# Patient Record
Sex: Female | Born: 1975 | Race: Black or African American | Hispanic: No | Marital: Single | Smoking: Current every day smoker
Health system: Southern US, Community
[De-identification: ages and names within clinical notes are randomized; demographics above are authoritative.]

## PROBLEM LIST (undated history)

## (undated) ENCOUNTER — Inpatient Hospital Stay (HOSPITAL_COMMUNITY): Payer: Self-pay

## (undated) DIAGNOSIS — L732 Hidradenitis suppurativa: Secondary | ICD-10-CM

## (undated) DIAGNOSIS — O24419 Gestational diabetes mellitus in pregnancy, unspecified control: Secondary | ICD-10-CM

## (undated) HISTORY — PX: SALPINGECTOMY: SHX328

## (undated) HISTORY — PX: ECTOPIC PREGNANCY SURGERY: SHX613

## (undated) HISTORY — DX: Gestational diabetes mellitus in pregnancy, unspecified control: O24.419

## (undated) HISTORY — PX: OTHER SURGICAL HISTORY: SHX169

## (undated) HISTORY — PX: DILATION AND CURETTAGE OF UTERUS: SHX78

---

## 1998-01-31 ENCOUNTER — Inpatient Hospital Stay (HOSPITAL_COMMUNITY): Admission: AD | Admit: 1998-01-31 | Discharge: 1998-01-31 | Payer: Self-pay | Admitting: *Deleted

## 1998-02-26 ENCOUNTER — Inpatient Hospital Stay (HOSPITAL_COMMUNITY): Admission: AD | Admit: 1998-02-26 | Discharge: 1998-02-26 | Payer: Self-pay | Admitting: *Deleted

## 1998-04-29 ENCOUNTER — Ambulatory Visit (HOSPITAL_COMMUNITY): Admission: RE | Admit: 1998-04-29 | Discharge: 1998-04-29 | Payer: Self-pay | Admitting: *Deleted

## 1998-07-19 ENCOUNTER — Inpatient Hospital Stay (HOSPITAL_COMMUNITY): Admission: AD | Admit: 1998-07-19 | Discharge: 1998-07-19 | Payer: Self-pay | Admitting: Obstetrics

## 1998-08-26 ENCOUNTER — Inpatient Hospital Stay (HOSPITAL_COMMUNITY): Admission: AD | Admit: 1998-08-26 | Discharge: 1998-08-26 | Payer: Self-pay | Admitting: Obstetrics & Gynecology

## 1998-09-11 ENCOUNTER — Emergency Department (HOSPITAL_COMMUNITY): Admission: EM | Admit: 1998-09-11 | Discharge: 1998-09-11 | Payer: Self-pay | Admitting: Emergency Medicine

## 1998-09-14 ENCOUNTER — Emergency Department (HOSPITAL_COMMUNITY): Admission: EM | Admit: 1998-09-14 | Discharge: 1998-09-14 | Payer: Self-pay

## 1998-09-21 ENCOUNTER — Inpatient Hospital Stay (HOSPITAL_COMMUNITY): Admission: AD | Admit: 1998-09-21 | Discharge: 1998-09-21 | Payer: Self-pay | Admitting: Obstetrics & Gynecology

## 1998-09-22 ENCOUNTER — Inpatient Hospital Stay (HOSPITAL_COMMUNITY): Admission: AD | Admit: 1998-09-22 | Discharge: 1998-09-24 | Payer: Self-pay | Admitting: Obstetrics & Gynecology

## 1999-04-13 ENCOUNTER — Other Ambulatory Visit: Admission: RE | Admit: 1999-04-13 | Discharge: 1999-04-13 | Payer: Self-pay | Admitting: Obstetrics

## 2001-07-03 ENCOUNTER — Emergency Department (HOSPITAL_COMMUNITY): Admission: EM | Admit: 2001-07-03 | Discharge: 2001-07-03 | Payer: Self-pay | Admitting: Emergency Medicine

## 2001-11-01 ENCOUNTER — Inpatient Hospital Stay (HOSPITAL_COMMUNITY): Admission: AD | Admit: 2001-11-01 | Discharge: 2001-11-01 | Payer: Self-pay | Admitting: Obstetrics

## 2001-11-16 ENCOUNTER — Encounter: Admission: RE | Admit: 2001-11-16 | Discharge: 2001-11-16 | Payer: Self-pay | Admitting: Obstetrics

## 2002-08-21 ENCOUNTER — Inpatient Hospital Stay (HOSPITAL_COMMUNITY): Admission: AD | Admit: 2002-08-21 | Discharge: 2002-08-21 | Payer: Self-pay | Admitting: Obstetrics

## 2003-07-10 ENCOUNTER — Inpatient Hospital Stay (HOSPITAL_COMMUNITY): Admission: AD | Admit: 2003-07-10 | Discharge: 2003-07-10 | Payer: Self-pay | Admitting: Obstetrics

## 2003-08-01 ENCOUNTER — Encounter (HOSPITAL_BASED_OUTPATIENT_CLINIC_OR_DEPARTMENT_OTHER): Payer: Self-pay | Admitting: General Surgery

## 2003-08-05 ENCOUNTER — Encounter (INDEPENDENT_AMBULATORY_CARE_PROVIDER_SITE_OTHER): Payer: Self-pay | Admitting: *Deleted

## 2003-08-05 ENCOUNTER — Ambulatory Visit (HOSPITAL_COMMUNITY): Admission: RE | Admit: 2003-08-05 | Discharge: 2003-08-05 | Payer: Self-pay | Admitting: General Surgery

## 2003-10-12 ENCOUNTER — Inpatient Hospital Stay (HOSPITAL_COMMUNITY): Admission: AD | Admit: 2003-10-12 | Discharge: 2003-10-12 | Payer: Self-pay | Admitting: Obstetrics

## 2004-03-25 ENCOUNTER — Inpatient Hospital Stay (HOSPITAL_COMMUNITY): Admission: AD | Admit: 2004-03-25 | Discharge: 2004-03-25 | Payer: Self-pay | Admitting: Obstetrics

## 2004-11-05 ENCOUNTER — Inpatient Hospital Stay (HOSPITAL_COMMUNITY): Admission: AD | Admit: 2004-11-05 | Discharge: 2004-11-05 | Payer: Self-pay | Admitting: Obstetrics

## 2005-06-02 ENCOUNTER — Inpatient Hospital Stay (HOSPITAL_COMMUNITY): Admission: AD | Admit: 2005-06-02 | Discharge: 2005-06-02 | Payer: Self-pay | Admitting: Family Medicine

## 2006-04-26 ENCOUNTER — Emergency Department (HOSPITAL_COMMUNITY): Admission: EM | Admit: 2006-04-26 | Discharge: 2006-04-26 | Payer: Self-pay | Admitting: Family Medicine

## 2006-07-27 ENCOUNTER — Emergency Department (HOSPITAL_COMMUNITY): Admission: EM | Admit: 2006-07-27 | Discharge: 2006-07-27 | Payer: Self-pay | Admitting: Family Medicine

## 2006-11-23 ENCOUNTER — Emergency Department (HOSPITAL_COMMUNITY): Admission: EM | Admit: 2006-11-23 | Discharge: 2006-11-23 | Payer: Self-pay | Admitting: Family Medicine

## 2007-09-19 ENCOUNTER — Ambulatory Visit (HOSPITAL_COMMUNITY): Admission: RE | Admit: 2007-09-19 | Discharge: 2007-09-19 | Payer: Self-pay | Admitting: Family Medicine

## 2007-10-10 ENCOUNTER — Ambulatory Visit (HOSPITAL_COMMUNITY): Admission: RE | Admit: 2007-10-10 | Discharge: 2007-10-10 | Payer: Self-pay | Admitting: Family Medicine

## 2007-10-24 ENCOUNTER — Ambulatory Visit (HOSPITAL_COMMUNITY): Admission: RE | Admit: 2007-10-24 | Discharge: 2007-10-24 | Payer: Self-pay | Admitting: Family Medicine

## 2008-03-07 ENCOUNTER — Inpatient Hospital Stay (HOSPITAL_COMMUNITY): Admission: AD | Admit: 2008-03-07 | Discharge: 2008-03-07 | Payer: Self-pay | Admitting: Obstetrics & Gynecology

## 2008-03-07 ENCOUNTER — Inpatient Hospital Stay (HOSPITAL_COMMUNITY): Admission: AD | Admit: 2008-03-07 | Discharge: 2008-03-09 | Payer: Self-pay | Admitting: Obstetrics & Gynecology

## 2008-03-07 ENCOUNTER — Ambulatory Visit: Payer: Self-pay | Admitting: Obstetrics & Gynecology

## 2010-01-14 ENCOUNTER — Emergency Department (HOSPITAL_COMMUNITY): Admission: EM | Admit: 2010-01-14 | Discharge: 2010-01-14 | Payer: Self-pay | Admitting: Emergency Medicine

## 2010-10-09 ENCOUNTER — Inpatient Hospital Stay (HOSPITAL_COMMUNITY): Admission: AD | Admit: 2010-10-09 | Discharge: 2010-10-10 | Payer: Self-pay | Admitting: Obstetrics & Gynecology

## 2010-10-09 ENCOUNTER — Ambulatory Visit: Payer: Self-pay | Admitting: Nurse Practitioner

## 2010-10-21 ENCOUNTER — Emergency Department (HOSPITAL_COMMUNITY): Admission: EM | Admit: 2010-10-21 | Discharge: 2010-10-21 | Payer: Self-pay | Admitting: Family Medicine

## 2011-02-16 LAB — WET PREP, GENITAL

## 2011-02-16 LAB — GC/CHLAMYDIA PROBE AMP, GENITAL
Chlamydia, DNA Probe: NEGATIVE
GC Probe Amp, Genital: NEGATIVE

## 2011-02-24 LAB — CULTURE, ROUTINE-ABSCESS

## 2011-04-23 NOTE — Op Note (Signed)
   Renee Schroeder, Renee Schroeder                         ACCOUNT NO.:  [id number]   MEDICAL RECORD NO.:  [id number]                   PATIENT TYPE:  OIB   LOCATION:  2895                                 FACILITY:  [facility]   PHYSICIAN:  Leonie Man, M.D.                DATE OF BIRTH:  07-Mar-1976   DATE OF PROCEDURE:  08/05/2003  DATE OF DISCHARGE:                                 OPERATIVE REPORT   PREOPERATIVE DIAGNOSIS:  Hidradenitis, right axilla.   POSTOPERATIVE DIAGNOSIS:  Hidradenitis, right axilla.   OPERATION PERFORMED:  Excision, hidradenitis, right axilla.   SURGEON:  Leonie Man, M.D.   ASSISTANT:  Nurse.   ANESTHESIA:  General.   INDICATIONS FOR PROCEDURE:  The patient is a 36 year old woman with  recurrent hydradenitis of the left axilla treated with warm compresses and  antibiotics.  She comes to the operating room now for excision of her  hidradenitis after the risks and potential benefits of surgery had been  fully discussed, all questions answered and consent obtained.   DESCRIPTION OF PROCEDURE:  Following the induction of satisfactory general  anesthesia, the right axilla and arm were prepped and draped to be included  in a sterile operative field.  The region around the hidradenitis  infiltrated with 0.5% Marcaine with epinephrine.  The region was extended  excised with a rather large ellipse axilla extending from the anterior  axilla to the posterior axilla.  There were some other lesions somewhat  lower down on the arm which were excised in a sort of a V-shaped incision  extending from the primary incision.  All areas of hidradenitis that were  visible were excised in entirety.  Hemostasis was obtained with  electrocautery.  Sponge and instrument counts were verified.  Subcutaneous  tissue was closed with interrupted 3-0 Vicryl sutures.  This was closed a T-  shaped fashion, so as to accommodate the extra tissue that was taken from  the upper arm.  The skin  was closed with a running 4-0 Monocryl suture.  Sterile dressings were applied.  Anesthetic was reversed and the patient  removed from the operating room to the recovery room in stable condition,  having tolerated the procedure well.                                                Leonie Man, M.D.    PB/MEDQ  D:  08/05/2003  T:  08/05/2003  Job:  [id number]

## 2011-08-31 LAB — STREP B DNA PROBE: Strep Group B Ag: NEGATIVE

## 2011-08-31 LAB — RPR: RPR Ser Ql: NONREACTIVE

## 2011-08-31 LAB — CBC
HCT: 41.7
Platelets: 199
RDW: 14.1
WBC: 8.2

## 2011-10-05 ENCOUNTER — Inpatient Hospital Stay (INDEPENDENT_AMBULATORY_CARE_PROVIDER_SITE_OTHER)
Admission: RE | Admit: 2011-10-05 | Discharge: 2011-10-05 | Disposition: A | Discharge status: Home / Self Care | Payer: Self-pay | Source: Ambulatory Visit | Attending: Family Medicine | Admitting: Family Medicine

## 2011-10-05 DIAGNOSIS — J020 Streptococcal pharyngitis: Secondary | ICD-10-CM

## 2011-10-05 LAB — POCT RAPID STREP A: Streptococcus, Group A Screen (Direct): NEGATIVE

## 2012-05-26 ENCOUNTER — Ambulatory Visit (HOSPITAL_COMMUNITY)
Admission: AD | Admit: 2012-05-26 | Discharge: 2012-05-27 | Disposition: A | Discharge status: Home / Self Care | Payer: Medicaid Other | Source: Ambulatory Visit | Attending: Obstetrics and Gynecology | Admitting: Obstetrics and Gynecology

## 2012-05-26 ENCOUNTER — Encounter (HOSPITAL_COMMUNITY)
Admission: AD | Disposition: A | Discharge status: Home / Self Care | Payer: Self-pay | Source: Ambulatory Visit | Attending: Obstetrics and Gynecology

## 2012-05-26 ENCOUNTER — Inpatient Hospital Stay (HOSPITAL_COMMUNITY): Payer: Medicaid Other | Admitting: Anesthesiology

## 2012-05-26 ENCOUNTER — Encounter (HOSPITAL_COMMUNITY): Payer: Self-pay | Admitting: *Deleted

## 2012-05-26 ENCOUNTER — Encounter (HOSPITAL_COMMUNITY): Payer: Self-pay | Admitting: Anesthesiology

## 2012-05-26 ENCOUNTER — Inpatient Hospital Stay (HOSPITAL_COMMUNITY): Payer: Medicaid Other

## 2012-05-26 DIAGNOSIS — O00109 Unspecified tubal pregnancy without intrauterine pregnancy: Principal | ICD-10-CM | POA: Insufficient documentation

## 2012-05-26 DIAGNOSIS — O009 Unspecified ectopic pregnancy without intrauterine pregnancy: Secondary | ICD-10-CM | POA: Diagnosis present

## 2012-05-26 HISTORY — PX: LAPAROSCOPY: SHX197

## 2012-05-26 LAB — URINALYSIS, ROUTINE W REFLEX MICROSCOPIC
Glucose, UA: NEGATIVE mg/dL
Ketones, ur: NEGATIVE mg/dL
Leukocytes, UA: NEGATIVE
Nitrite: NEGATIVE
Protein, ur: NEGATIVE mg/dL
Urobilinogen, UA: 0.2 mg/dL (ref 0.0–1.0)

## 2012-05-26 LAB — CBC
MCH: 30.2 pg (ref 26.0–34.0)
MCHC: 34.4 g/dL (ref 30.0–36.0)
MCV: 87.7 fL (ref 78.0–100.0)
Platelets: 231 10*3/uL (ref 150–400)
RBC: 3.91 MIL/uL (ref 3.87–5.11)

## 2012-05-26 LAB — POCT PREGNANCY, URINE: Preg Test, Ur: POSITIVE — AB

## 2012-05-26 LAB — URINE MICROSCOPIC-ADD ON

## 2012-05-26 SURGERY — LAPAROSCOPY OPERATIVE
Anesthesia: General | Site: Abdomen | Wound class: Clean Contaminated

## 2012-05-26 MED ORDER — LIDOCAINE HCL (CARDIAC) 20 MG/ML IV SOLN
INTRAVENOUS | Status: DC | PRN
  Administered 2012-05-26: 50 mg via INTRAVENOUS

## 2012-05-26 MED ORDER — NEOSTIGMINE METHYLSULFATE 1 MG/ML IJ SOLN
INTRAMUSCULAR | Status: DC | PRN
  Administered 2012-05-26: 3 mg via INTRAVENOUS

## 2012-05-26 MED ORDER — FAMOTIDINE IN NACL 20-0.9 MG/50ML-% IV SOLN
20.0000 mg | Freq: Once | INTRAVENOUS | Status: AC
  Administered 2012-05-26: 20 mg via INTRAVENOUS

## 2012-05-26 MED ORDER — LACTATED RINGERS IV SOLN
INTRAVENOUS | Status: DC
  Administered 2012-05-26 (×3): via INTRAVENOUS

## 2012-05-26 MED ORDER — CITRIC ACID-SODIUM CITRATE 334-500 MG/5ML PO SOLN
30.0000 mL | Freq: Once | ORAL | Status: AC
  Administered 2012-05-26: 30 mL via ORAL

## 2012-05-26 MED ORDER — CITRIC ACID-SODIUM CITRATE 334-500 MG/5ML PO SOLN
ORAL | Status: AC
  Filled 2012-05-26: qty 15

## 2012-05-26 MED ORDER — ROCURONIUM BROMIDE 100 MG/10ML IV SOLN
INTRAVENOUS | Status: DC | PRN
  Administered 2012-05-26: 25 mg via INTRAVENOUS
  Administered 2012-05-26: 5 mg via INTRAVENOUS

## 2012-05-26 MED ORDER — LACTATED RINGERS IR SOLN
Status: DC | PRN
  Administered 2012-05-26: 3000 mL

## 2012-05-26 MED ORDER — FENTANYL CITRATE 0.05 MG/ML IJ SOLN
INTRAMUSCULAR | Status: DC | PRN
  Administered 2012-05-26 (×2): 100 ug via INTRAVENOUS
  Administered 2012-05-26: 150 ug via INTRAVENOUS

## 2012-05-26 MED ORDER — KETOROLAC TROMETHAMINE 30 MG/ML IJ SOLN
INTRAMUSCULAR | Status: DC | PRN
  Administered 2012-05-26: 30 mg via INTRAVENOUS

## 2012-05-26 MED ORDER — EPHEDRINE SULFATE 50 MG/ML IJ SOLN
INTRAMUSCULAR | Status: DC | PRN
  Administered 2012-05-26: 10 mg via INTRAVENOUS

## 2012-05-26 MED ORDER — GLYCOPYRROLATE 0.2 MG/ML IJ SOLN
INTRAMUSCULAR | Status: DC | PRN
  Administered 2012-05-26: 0.3 mg via INTRAVENOUS

## 2012-05-26 MED ORDER — PROPOFOL 10 MG/ML IV EMUL
INTRAVENOUS | Status: DC | PRN
  Administered 2012-05-26: 200 mg via INTRAVENOUS

## 2012-05-26 MED ORDER — FAMOTIDINE IN NACL 20-0.9 MG/50ML-% IV SOLN
INTRAVENOUS | Status: AC
  Filled 2012-05-26: qty 50

## 2012-05-26 MED ORDER — SUCCINYLCHOLINE CHLORIDE 20 MG/ML IJ SOLN
INTRAMUSCULAR | Status: DC | PRN
  Administered 2012-05-26: 160 mg via INTRAVENOUS

## 2012-05-26 MED ORDER — ONDANSETRON 8 MG PO TBDP
8.0000 mg | ORAL_TABLET | Freq: Once | ORAL | Status: AC
  Administered 2012-05-26: 8 mg via ORAL
  Filled 2012-05-26: qty 1

## 2012-05-26 MED ORDER — MIDAZOLAM HCL 5 MG/5ML IJ SOLN
INTRAMUSCULAR | Status: DC | PRN
  Administered 2012-05-26 (×2): 1 mg via INTRAVENOUS

## 2012-05-26 MED ORDER — ONDANSETRON HCL 4 MG/2ML IJ SOLN
INTRAMUSCULAR | Status: DC | PRN
  Administered 2012-05-26: 4 mg via INTRAVENOUS

## 2012-05-26 SURGICAL SUPPLY — 30 items
BUPIVACAINE 0.25% IMPLANT
CHLORAPREP W/TINT 26ML (MISCELLANEOUS) ×2 IMPLANT
DERMABOND ADVANCED (GAUZE/BANDAGES/DRESSINGS) ×1
DERMABOND ADVANCED .7 DNX12 (GAUZE/BANDAGES/DRESSINGS) ×1 IMPLANT
ELECT REM PT RETURN 9FT ADLT (ELECTROSURGICAL) ×2
ELECTRODE REM PT RTRN 9FT ADLT (ELECTROSURGICAL) ×1 IMPLANT
GLOVE BIOGEL PI IND STRL 6.5 (GLOVE) ×2 IMPLANT
GLOVE BIOGEL PI INDICATOR 6.5 (GLOVE) ×2
GLOVE SKINSENSE NS SZ7.5 (GLOVE) ×1
GLOVE SKINSENSE NS SZ8.0 LF (GLOVE) ×1
GLOVE SKINSENSE STRL SZ7.5 (GLOVE) ×1 IMPLANT
GLOVE SKINSENSE STRL SZ8.0 LF (GLOVE) ×1 IMPLANT
GLOVE SURG SS PI 6.0 STRL IVOR (GLOVE) ×2 IMPLANT
GOWN PREVENTION PLUS LG XLONG (DISPOSABLE) ×4 IMPLANT
NS IRRIG 1000ML POUR BTL (IV SOLUTION) IMPLANT
PACK LAPAROSCOPY BASIN (CUSTOM PROCEDURE TRAY) ×2 IMPLANT
POUCH SPECIMEN RETRIEVAL 10MM (ENDOMECHANICALS) ×2 IMPLANT
PROTECTOR NERVE ULNAR (MISCELLANEOUS) ×2 IMPLANT
SEALER TISSUE G2 CVD JAW 35 (ENDOMECHANICALS) ×1 IMPLANT
SEALER TISSUE G2 CVD JAW 45CM (ENDOMECHANICALS) ×1
SET IRRIG TUBING LAPAROSCOPIC (IRRIGATION / IRRIGATOR) ×2 IMPLANT
SLEEVE SCD COMPRESS KNEE MED (MISCELLANEOUS) ×2 IMPLANT
SUT MNCRL AB 4-0 PS2 18 (SUTURE) ×2 IMPLANT
SUT VICRYL 0 UR6 27IN ABS (SUTURE) ×4 IMPLANT
TOWEL OR 17X24 6PK STRL BLUE (TOWEL DISPOSABLE) ×4 IMPLANT
TRAY FOLEY CATH 14FR (SET/KITS/TRAYS/PACK) ×2 IMPLANT
TROCAR BALLN 12MMX100 BLUNT (TROCAR) ×2 IMPLANT
TROCAR XCEL NON-BLD 11X100MML (ENDOMECHANICALS) ×2 IMPLANT
TROCAR XCEL NON-BLD 5MMX100MML (ENDOMECHANICALS) ×4 IMPLANT
WATER STERILE IRR 1000ML POUR (IV SOLUTION) IMPLANT

## 2012-05-26 NOTE — MAU Note (Signed)
Pt vomited large amt undigested food 

## 2012-05-26 NOTE — MAU Note (Signed)
U/s at bedside

## 2012-05-26 NOTE — MAU Note (Signed)
Pt called out stating she couldn't breathe. O2 sat 100. Renee Punt NP at bedside. Pt hyperventilating some. Encouraged to slow respiratons and emotional support given. After staying with pt few mins pt slowed breathing and more reassured.

## 2012-05-26 NOTE — Progress Notes (Signed)
Having bedside u/s. Alert. Having waves of intensity in pain but pain is constant.

## 2012-05-26 NOTE — Progress Notes (Signed)
Results of ultrasound reviewed. Patient examined with diffuse abdominal tenderness. Diagnosis of ruptured ectopic pregnancy explained along with need for surgical treatment with laparoscopy and removal of tube. Risks, benefits and alternatives explained including but not limited to risk of bleeding, infection and damage to adjacent organs. Patient verbalized understanding. All questions were answered. Patient consented for laparoscopic salpingectomy.

## 2012-05-26 NOTE — MAU Note (Signed)
Pt states, " I was washing my hair an hour ago and had a sudden onset of pain in my whole abdomen, with vaginal and rectal pain that feels like contractions. I started my period three days ago, but it has been acting up and has been irregular and stayed on longer."

## 2012-05-26 NOTE — MAU Provider Note (Signed)
History     CSN: [id number]  Arrival date and time: 05/26/12 1910   First Provider Initiated Contact with Patient 05/26/12 2058      Chief Complaint  Patient presents with  . Abdominal Pain  . Vaginal Bleeding   HPI Renee Schroeder 36 y.o. Comes to MAU tonight with sudden onset of abdominal pain about 6:30 pm which caused her to fall to the floor.  Had eaten lunch - had BBQ.  Had orange juice to drink at 5:30 p, amd pain began at 6:30 pm.  Vomiting in MAU.  Is diaphoretic.  Is chilling and then is hot.  Pregnancy test is positive and client is surprised.    OB History    Grav Para Term Preterm Abortions TAB SAB Ect Mult Living   3 2 2  0 1 1 0 0 0 2      Past Medical History  Diagnosis Date  . No pertinent past medical history     Past Surgical History  Procedure Date  . Dilation and curettage of uterus   . Arm amputation     R arm sweat glands removed due to hydrinitis    Family History  Problem Relation Age of Onset  . Other Neg Hx     History  Substance Use Topics  . Smoking status: Current Some Day Smoker  . Smokeless tobacco: Not on file  . Alcohol Use: No    Allergies: No Known Allergies  No prescriptions prior to admission    Review of Systems  Gastrointestinal: Positive for nausea, vomiting and abdominal pain.  Genitourinary: Negative for dysuria.   Physical Exam   Blood pressure 96/55, pulse 62, temperature 98.3 F (36.8 C), resp. rate 22, height 5\' 8"  (1.727 m), weight 183 lb (83.008 kg), last menstrual period 05/23/2012, SpO2 100.00%.  Physical Exam  Nursing note and vitals reviewed. Constitutional: She is oriented to person, place, and time. She appears well-developed and well-nourished.  HENT:  Head: Normocephalic.  Eyes: EOM are normal.  Neck: Neck supple.  GI: Soft. There is tenderness. There is guarding.  Musculoskeletal: Normal range of motion.  Neurological: She is alert and oriented to person, place, and time.  Skin: She is  diaphoretic.  Psychiatric: She has a normal mood and affect.    MAU Course  Procedures Zofran 8 mg ODT given, IV LR started.  Results for orders placed during the hospital encounter of 05/26/12 (from the past 24 hour(s))  URINALYSIS, ROUTINE W REFLEX MICROSCOPIC     Status: Abnormal   Collection Time   05/26/12  2:33 PM      Component Value Range   Color, Urine YELLOW  YELLOW   APPearance CLEAR  CLEAR   Specific Gravity, Urine 1.025  1.005 - 1.030   pH 6.0  5.0 - 8.0   Glucose, UA NEGATIVE  NEGATIVE mg/dL   Hgb urine dipstick LARGE (*) NEGATIVE   Bilirubin Urine NEGATIVE  NEGATIVE   Ketones, ur NEGATIVE  NEGATIVE mg/dL   Protein, ur NEGATIVE  NEGATIVE mg/dL   Urobilinogen, UA 0.2  0.0 - 1.0 mg/dL   Nitrite NEGATIVE  NEGATIVE   Leukocytes, UA NEGATIVE  NEGATIVE  URINE MICROSCOPIC-ADD ON     Status: Normal   Collection Time   05/26/12  2:33 PM      Component Value Range   Squamous Epithelial / LPF RARE  RARE   WBC, UA 0-2  <3 WBC/hpf   RBC / HPF 0-2  <3 RBC/hpf  POCT PREGNANCY, URINE     Status: Abnormal   Collection Time   05/26/12  8:19 PM      Component Value Range   Preg Test, Ur POSITIVE (*) NEGATIVE  HCG, QUANTITATIVE, PREGNANCY     Status: Abnormal   Collection Time   05/26/12  9:09 PM      Component Value Range   hCG, Beta Chain, Quant, S 3560 (*) <5 mIU/mL  CBC     Status: Abnormal   Collection Time   05/26/12  9:09 PM      Component Value Range   WBC 5.8  4.0 - 10.5 K/uL   RBC 3.91  3.87 - 5.11 MIL/uL   Hemoglobin 11.8 (*) 12.0 - 15.0 g/dL   HCT 16.1 (*) 09.6 - 04.5 %   MCV 87.7  78.0 - 100.0 fL   MCH 30.2  26.0 - 34.0 pg   MCHC 34.4  30.0 - 36.0 g/dL   RDW 40.9  81.1 - 91.4 %   Platelets 231  150 - 400 K/uL  ABO/RH     Status: Normal (Preliminary result)   Collection Time   05/26/12  9:09 PM      Component Value Range   ABO/RH(D) O POS     MDM Bedside ultrasound done.    Clinical Data: abd pain; ;  OBSTETRIC <14 WK [place] AND TRANSVAGINAL OB US    Technique: Both transabdominal and transvaginal ultrasound  examinations were performed for complete evaluation of the  gestation as well as the maternal uterus, adnexal regions, and  pelvic cul-de-sac.  Comparison: None.  Findings: No intrauterine gestation is visualized. There is a  large amount of blood within the cul-de-sac of the pelvis. The  patient experienced severe pain during transvaginal imaging and  asked for the study be terminated.  3.2 cm simple cyst in the right ovary. Nonvisualization of the  left ovary.  IMPRESSION:  No intrauterine gestation visualized. Large amount of blood within  the cul-de-sac. Recommend correlation with quantitative HCG level.  Given the blood in the cul-de-sac, I cannot exclude ruptured  ectopic pregnancy.   Received call from radiologist. Consult with Dr. Jolayne Panther who came to see client.  Discussed ruptured ectopic and plan to go to OR for surgery.  Assessment and Plan  Ruptured ectopic pregnancy  Plan Surgery  Chonte Ricke 05/26/2012, 9:00 PM

## 2012-05-26 NOTE — H&P (Signed)
See MAU note. 

## 2012-05-26 NOTE — Anesthesia Preprocedure Evaluation (Signed)
Anesthesia Evaluation  Patient identified by MRN, date of birth, ID band Patient awake    Reviewed: Allergy & Precautions, H&P , NPO status , Patient's Chart, lab work & pertinent test results  Airway Mallampati: III TM Distance: >3 FB Neck ROM: full    Dental No notable dental hx. (+) Teeth Intact   Pulmonary neg pulmonary ROS,    Pulmonary exam normal       Cardiovascular negative cardio ROS      Neuro/Psych negative neurological ROS  negative psych ROS   GI/Hepatic negative GI ROS, Neg liver ROS,   Endo/Other  negative endocrine ROS  Renal/GU negative Renal ROS  negative genitourinary   Musculoskeletal   Abdominal Normal abdominal exam  (+)   Peds  Hematology negative hematology ROS (+)   Anesthesia Other Findings   Reproductive/Obstetrics (+) Pregnancy                           Anesthesia Physical Anesthesia Plan  ASA: II and Emergent  Anesthesia Plan: General ETT   Post-op Pain Management:    Induction:   Airway Management Planned:   Additional Equipment:   Intra-op Plan:   Post-operative Plan:   Informed Consent: I have reviewed the patients History and Physical, chart, labs and discussed the procedure including the risks, benefits and alternatives for the proposed anesthesia with the patient or authorized representative who has indicated his/her understanding and acceptance.   Dental Advisory Given  Plan Discussed with: Anesthesiologist, CRNA and Surgeon  Anesthesia Plan Comments:         Anesthesia Quick Evaluation

## 2012-05-26 NOTE — Progress Notes (Signed)
Pt alert and respondsive

## 2012-05-27 DIAGNOSIS — O009 Unspecified ectopic pregnancy without intrauterine pregnancy: Secondary | ICD-10-CM | POA: Diagnosis present

## 2012-05-27 LAB — ABO/RH: ABO/RH(D): O POS

## 2012-05-27 MED ORDER — OXYCODONE-ACETAMINOPHEN 5-325 MG PO TABS
ORAL_TABLET | ORAL | Status: AC
  Filled 2012-05-27: qty 1

## 2012-05-27 MED ORDER — IBUPROFEN 800 MG PO TABS: 800.0000 mg | ORAL_TABLET | Freq: Three times a day (TID) | ORAL | Status: AC | PRN

## 2012-05-27 MED ORDER — OXYCODONE-ACETAMINOPHEN 5-325 MG PO TABS: 1.0000 | ORAL_TABLET | ORAL | Status: DC | PRN

## 2012-05-27 MED ORDER — HYDROMORPHONE HCL PF 1 MG/ML IJ SOLN: 0.2500 mg | INTRAMUSCULAR | Status: DC | PRN

## 2012-05-27 MED ORDER — OXYCODONE-ACETAMINOPHEN 5-325 MG PO TABS: 1.0000 | ORAL_TABLET | ORAL | Status: AC | PRN

## 2012-05-27 MED ORDER — DOCUSATE SODIUM 100 MG PO CAPS: 100.0000 mg | ORAL_CAPSULE | Freq: Two times a day (BID) | ORAL | Status: AC | PRN

## 2012-05-27 NOTE — Anesthesia Postprocedure Evaluation (Signed)
  Anesthesia Post-op Note  Patient: Renee Schroeder  Procedure(s) Performed: Procedure(s) (LRB): LAPAROSCOPY OPERATIVE (N/A)  Patient Location: PACU  Anesthesia Type: General  Level of Consciousness: awake, alert  and oriented  Airway and Oxygen Therapy: Patient Spontanous Breathing  Post-op Pain: mild  Post-op Assessment: Post-op Vital signs reviewed, Patient's Cardiovascular Status Stable, Respiratory Function Stable, Patent Airway, No signs of Nausea or vomiting and Pain level controlled  Post-op Vital Signs: Reviewed and stable  Complications: No apparent anesthesia complications

## 2012-05-27 NOTE — Op Note (Signed)
Renee Schroeder D Renee Schroeder PROCEDURE DATE: 05/26/2012  PREOPERATIVE DIAGNOSIS: Ruptured ectopic pregnancy POSTOPERATIVE DIAGNOSIS: Ruptured right fallopian tube ectopic pregnancy PROCEDURE: Laparoscopic right salpingectomy and removal of ectopic pregnancy SURGEON:  Dr. Catalina Antigua ASSISTANT: Dr. Gaspar Bidding, MD ANESTHESIOLOGIST: Tyrone Apple. Malen Gauze, MD Christene Lye, CRNA - CRNA Tyrone Apple. Malen Gauze, MD - Anesthesiologist  INDICATIONS: 36 y.o. [phone] at Unknown here for with ruptured ectopic pregnancy. On exam, she had stable vital signs, and an acute abdomen. Hgb  Lab Results  Component Value Date   HGB 11.8* 05/26/2012   , blood type  O pos. Patient was counseled regarding need for laparoscopic salpingectomy. Risks of surgery including bleeding which may require transfusion or reoperation, infection, injury to bowel or other surrounding organs, need for additional procedures including laparotomy and other postoperative/anesthesia complications were explained to patient.  Written informed consent was obtained.  FINDINGS: moderate amount of hemoperitoneum estimated to be about 500 of blood and clots.  Dilated right fallopian tube containing ectopic gestation. Small normal appearing uterus, normal left fallopian tube, right ovary and left ovary. Adhesion of right adnexal region to small bowel. Normal upper abdomen with evidence of Fitz-Hugh-Curtis.  ANESTHESIA: General INTRAVENOUS FLUIDS: .2000 ml ESTIMATED BLOOD LOSS:  minimal URINE OUTPUT: 100 ml SPECIMENS: right fallopian tube containing ectopic gestation COMPLICATIONS: None immediate  PROCEDURE IN DETAIL:  The patient was taken to the operating room where general anesthesia was administered and was found to be adequate.  She was placed in the dorsal lithotomy position, and was prepped and draped in a sterile manner.  A Foley catheter was inserted into her bladder and attached to Zarinah Oviatt drainage and a uterine manipulator was then advanced  into the uterus .  After an adequate timeout was performed, attention was then turned to the patient's abdomen where a 10-mm skin incision was made on the umbilical fold.  The fascia was identified, tented up and incised with Mayo scissors. The fascia was tagged with 0- Vicryl. The peritoneum was identified tented up and entered sharply with Metzenbaum scissors.  10-mm trocar and sleeve were then advanced without difficulty into the abdomen where intraabdominal placement was confirmed by the laparoscope. Pneumoperitoneum was achieved with insufflation of CO2 gas. A survey of the patient's pelvis and abdomen revealed the findings as above.  Bilateral lower quadrant ports were placed under direct visualization; 5-mm on the left and 5-mm on the right.  The 5-mm Nezhat suction irrigator was then used to suction the hemoperitoneum and irrigate the pelvis.  Attention was then turned to the right fallopian tube which was grasped and ligated from the underlying mesosalpinx and uterine attachment using the Enseal instrument.  Good hemostasis was noted.  The specimen was placed in an EndoCatch bag and removed from the abdomen intact.  The abdomen was desufflated, and all instruments were removed.  The fascial incisions of both 10-mm sites were reapproximated with 0 Vicryl figure-of-eight stiches; and all skin incisions were closed with a 3-0 Vicryl subcuticular stitch. The patient tolerated the procedures well.  All instruments, needles, and sponge counts were correct x 2. The patient was taken to the recovery room in stable condition.    The patient will be discharged to home as per PACU criteria.  Routine postoperative instructions given.  She was prescribed Percocet, Ibuprofen and Colace.  She will follow up in the clinic in 2 weeks for postoperative evaluation .

## 2012-05-27 NOTE — Transfer of Care (Signed)
Immediate Anesthesia Transfer of Care Note  Patient: Renee Schroeder  Procedure(s) Performed: Procedure(s) (LRB): LAPAROSCOPY OPERATIVE (N/A)  Patient Location: PACU  Anesthesia Type: General  Level of Consciousness: awake, alert  and oriented  Airway & Oxygen Therapy: Patient Spontanous Breathing and Patient connected to nasal cannula oxygen  Post-op Assessment: Report given to PACU RN and Post -op Vital signs reviewed and stable  Post vital signs: Reviewed and stable  Complications: No apparent anesthesia complications

## 2012-05-27 NOTE — Discharge Instructions (Signed)
Diagnostic Laparoscopy Laparoscopy is a surgical procedure. It is used to diagnose and treat diseases inside the belly(abdomen). It is usually a brief, common, and relatively simple procedure. The laparoscopeis a thin, lighted, pencil-sized instrument. It is like a telescope. It is inserted into your abdomen through a small cut (incision). Your caregiver can look at the organs inside your body through this instrument. He or she can see if there is anything abnormal. Laparoscopy can be done either in a hospital or outpatient clinic. You may be given a mild sedative to help you relax before the procedure. Once in the operating room, you will be given a drug to make you sleep (general anesthesia). Laparoscopy usually lasts less than 1 hour. After the procedure, you will be monitored in a recovery area until you are stable and doing well. Once you are home, it will take 2 to 3 days to fully recover. RISKS AND COMPLICATIONS  Laparoscopy has relatively few risks. Your caregiver will discuss the risks with you before the procedure. Some problems that can occur include:  Infection.   Bleeding.   Damage to other organs.   Anesthetic side effects.  PROCEDURE Once you receive anesthesia, your surgeon inflates the abdomen with a harmless gas (carbon dioxide). This makes the organs easier to see. The laparoscope is inserted into the abdomen through a small incision. This allows your surgeon to see into the abdomen. Other small instruments are also inserted into the abdomen through other small openings. Many surgeons attach a video camera to the laparoscope to enlarge the view. During a diagnostic laparoscopy, the surgeon may be looking for inflammation, infection, or cancer. Your surgeon may take tissue samples(biopsies). The samples are sent to a specialist in looking at cells and tissue samples (pathologist). The pathologist examines them under a microscope. Biopsies can help to diagnose or confirm a  disease. AFTER THE PROCEDURE   The gas is released from inside the abdomen.   The incisions are closed with stitches (sutures). Because these incisions are small (usually less than 1/2 inch), there is usually minimal discomfort after the procedure. There may be some mild discomfort in the throat. This is from the tube placed in the throat while you were sleeping. You may have some mild abdominal discomfort. There may also be discomfort from the instrument placement incisions in the abdomen.   The recovery time is shortened as long as there are no complications.   You will rest in a recovery room until stable and doing well. As long as there are no complications, you may be allowed to go home.  FINDING OUT THE RESULTS OF YOUR TEST Not all test results are available during your visit. If your test results are not back during the visit, make an appointment with your caregiver to find out the results. Do not assume everything is normal if you have not heard from your caregiver or the medical facility. It is important for you to follow up on all of your test results. HOME CARE INSTRUCTIONS   Take all medicines as directed.   Only take over-the-counter or prescription medicines for pain, discomfort, or fever as directed by your caregiver.   Resume daily activities as directed.   Showers are preferred over baths.   You may resume sexual activities in 1 week or as directed.   Do not drive while taking narcotics.  SEEK MEDICAL CARE IF:   There is increasing abdominal pain.   There is new pain in the shoulders (shoulder strap areas).     You feel lightheaded or faint.   You have the chills.   You or your child has an oral temperature above 100.4 F (38 C).   There is pus-like (purulent) drainage from any of the wounds.   You are unable to pass gas or have a bowel movement.   You feel sick to your stomach (nauseous) or throw up (vomit).  MAKE SURE YOU:   Understand these instructions.    Will watch your condition.   Will get help right away if you are not doing well or get worse.  Document Released: 02/28/2001 Document Revised: 11/11/2011 Document Reviewed: 11/22/2007 [facility] Patient Information 2012 [place], [place].

## 2012-05-29 ENCOUNTER — Encounter (HOSPITAL_COMMUNITY): Payer: Self-pay | Admitting: Obstetrics and Gynecology

## 2012-05-29 NOTE — MAU Provider Note (Signed)
Agree with above note.  Sumedh Shinsato 05/29/2012 2:23 PM

## 2012-06-15 ENCOUNTER — Ambulatory Visit (INDEPENDENT_AMBULATORY_CARE_PROVIDER_SITE_OTHER): Payer: Self-pay | Admitting: Obstetrics and Gynecology

## 2012-06-15 ENCOUNTER — Encounter: Payer: Self-pay | Admitting: Obstetrics and Gynecology

## 2012-06-15 VITALS — BP 104/72 | HR 81 | Temp 98.1°F | Ht 69.0 in | Wt 186.2 lb

## 2012-06-15 DIAGNOSIS — Z124 Encounter for screening for malignant neoplasm of cervix: Secondary | ICD-10-CM

## 2012-06-15 DIAGNOSIS — Z09 Encounter for follow-up examination after completed treatment for conditions other than malignant neoplasm: Secondary | ICD-10-CM

## 2012-06-15 MED ORDER — NORGESTIMATE-ETH ESTRADIOL 0.25-35 MG-MCG PO TABS: 1.0000 | ORAL_TABLET | Freq: Every day | ORAL | Status: DC

## 2012-06-15 NOTE — Progress Notes (Signed)
C/o has had gas pain and rectal pain since surgery

## 2012-06-15 NOTE — Progress Notes (Signed)
  Subjective:    Patient ID: Renee Schroeder, female    DOB: January 25, 1976, 36 y.o.   MRN: [id number]  HPI 36 yo [id number] s/p LSC salpingectomy on 6/21 for the treatment of ectopic pregnancy presenting today for post-op check. Patient reports doing well following surgery with the exception of rectal pain which resolved following a bowel movement this morning (first bowel movement since surgery). This was an undesired pregnancy and patient is interested in birth control.   Past Medical History  Diagnosis Date  . No pertinent past medical history    Past Surgical History  Procedure Date  . Dilation and curettage of uterus   . Arm amputation     R arm sweat glands removed due to hydrinitis  . Laparoscopy 05/26/2012    Procedure: LAPAROSCOPY OPERATIVE;  Surgeon: Catalina Antigua, MD;  Location: [facility];  Service: Gynecology;  Laterality: N/A;  Operative Laparoscopy with Right Salpingectomy   Family History  Problem Relation Age of Onset  . Other Neg Hx    History  Substance Use Topics  . Smoking status: Current Some Day Smoker -- 0.2 packs/day    Types: Cigarettes  . Smokeless tobacco: Not on file  . Alcohol Use: No      Review of Systems  All other systems reviewed and are negative.       Objective:   Physical Exam   Abd: soft/NT/ND Incisions all healed well Ext: NT, equal in size     Assessment & Plan:  36 yo s/p LSC salpingectomy for treatment of ectopic pregnancy - Patient medically cleared to resume all activities of daily living - pap smear performed today as patient has not had a pap in years - OCP for birth control per patient request- no risk factors - Patient to return for full physical exam in a few weeks

## 2014-01-30 ENCOUNTER — Encounter (HOSPITAL_COMMUNITY): Payer: Self-pay | Admitting: Emergency Medicine

## 2014-01-30 ENCOUNTER — Emergency Department (HOSPITAL_COMMUNITY)
Admission: EM | Admit: 2014-01-30 | Discharge: 2014-01-30 | Disposition: A | Discharge status: Home / Self Care | Payer: BC Managed Care – PPO | Attending: Emergency Medicine | Admitting: Emergency Medicine

## 2014-01-30 DIAGNOSIS — F17200 Nicotine dependence, unspecified, uncomplicated: Secondary | ICD-10-CM | POA: Insufficient documentation

## 2014-01-30 DIAGNOSIS — M5430 Sciatica, unspecified side: Secondary | ICD-10-CM | POA: Insufficient documentation

## 2014-01-30 MED ORDER — HYDROCODONE-ACETAMINOPHEN 5-325 MG PO TABS: 1.0000 | ORAL_TABLET | Freq: Four times a day (QID) | ORAL | Status: DC | PRN

## 2014-01-30 MED ORDER — PREDNISONE 50 MG PO TABS: 50.0000 mg | ORAL_TABLET | Freq: Every day | ORAL | Status: DC

## 2014-01-30 MED ORDER — KETOROLAC TROMETHAMINE 60 MG/2ML IM SOLN
60.0000 mg | Freq: Once | INTRAMUSCULAR | Status: DC
  Filled 2014-01-30: qty 2

## 2014-01-30 MED ORDER — HYDROCODONE-ACETAMINOPHEN 5-325 MG PO TABS
1.0000 | ORAL_TABLET | Freq: Once | ORAL | Status: AC
  Administered 2014-01-30: 1 via ORAL
  Filled 2014-01-30: qty 1

## 2014-01-30 MED ORDER — CYCLOBENZAPRINE HCL 10 MG PO TABS: 10.0000 mg | ORAL_TABLET | Freq: Three times a day (TID) | ORAL | Status: DC | PRN

## 2014-01-30 NOTE — ED Notes (Signed)
C/O lower back pain to right leg with intermittent numbness x 2 weeks.

## 2014-01-30 NOTE — ED Provider Notes (Signed)
CSN: [id number]     Arrival date & time 01/30/14  1154 History   First MD Initiated Contact with Patient 01/30/14 1201     Chief Complaint  Patient presents with  . Back Pain     (Consider location/radiation/quality/duration/timing/severity/associated sxs/prior Treatment) HPI Patient presents to the emergency department with lower back pain started 2 weeks ago.  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  Patient, states she's had sciatica in the past.  Patient denies numbness, weakness, dizziness, dysuria, abdominal pain, nausea, vomiting, fever, chest pain, or shortness of breath.  The patient, states, that she did not take any medications prior to arrival.  Patient, states, that movement and palpation make the pain, worse.  Nothing seems to make her condition, better. Past Medical History  Diagnosis Date  . No pertinent past medical history   . Sciatica    Past Surgical History  Procedure Laterality Date  . Dilation and curettage of uterus    . 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 Laparoscopy  05/26/2012    Procedure: LAPAROSCOPY OPERATIVE;  Surgeon: Mora Bellman, MD;  Location: [facility];  Service: Gynecology;  Laterality: N/A;  Operative Laparoscopy with Right Salpingectomy   Family History  Problem Relation Age of Onset  . Other Neg Hx    History  Substance Use Topics  . Smoking status: Current Some Day Smoker -- 0.25 packs/day    Types: Cigarettes  . Smokeless tobacco: Not on file  . Alcohol Use: No   OB History   Grav Para Term Preterm Abortions TAB SAB Ect Mult Living   4 2 2  0 1 1 0 0 0 2     Review of Systems  All other systems negative except as documented in the HPI. All pertinent positives and negatives as reviewed in the HPI.  Allergies  Review of patient's allergies indicates no known allergies.  Home Medications   Current Outpatient Rx  Name   Route  Sig  Dispense  Refill  . EXPIRED: norgestimate-ethinyl estradiol (ORTHO-CYCLEN,SPRINTEC,PREVIFEM) 0.25-35 MG-MCG tablet   Oral   Take 1 tablet by mouth daily.   1 Package   11    BP 111/77  Pulse 72  Temp(Src) 97.7 F (36.5 C) (Oral)  Resp 18  Wt 195 lb (88.451 kg)  SpO2 100% Physical Exam  Constitutional: She is oriented to person, place, and time. She appears well-developed and well-nourished. No distress.  HENT:  Head: Normocephalic and atraumatic.  Pulmonary/Chest: Effort normal.  Musculoskeletal:       Lumbar back: She exhibits tenderness, pain and spasm. She exhibits no bony tenderness and no deformity.       Back:  Neurological: She is alert and oriented to person, place, and time. She exhibits normal muscle tone. Coordination normal.  Skin: Skin is warm and dry. No rash noted.    ED Course  Procedures (including critical care time) Patient be treated for sciatica and lumbar strain.  Patient is advised return here as needed.  Patient does not have any neurological deficits.  She has normal reflexes on exam.  Patient, states, that she will followup with her primary care Dr.. ice and heat on her lower back    Brent General, PA-C 01/30/14 1258

## 2014-01-30 NOTE — ED Notes (Signed)
Pt refused Toradol injection. 

## 2014-01-30 NOTE — ED Provider Notes (Signed)
Medical screening examination/treatment/procedure(s) were performed by non-physician practitioner and as supervising physician I was immediately available for consultation/collaboration.  EKG Interpretation   None         Delice Bison Daquann Merriott, DO 01/30/14 1355

## 2014-01-30 NOTE — Discharge Instructions (Signed)
Return here as needed.  Followup with a primary care Dr. or urgent care

## 2014-01-30 NOTE — ED Notes (Addendum)
Pt c/o lower back pain radiating down R leg for past 2 weeks. 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 Took ibuprofen with no relief. She had sciatica in the past and this feels the same

## 2014-02-20 ENCOUNTER — Emergency Department (HOSPITAL_COMMUNITY)
Admission: EM | Admit: 2014-02-20 | Discharge: 2014-02-20 | Disposition: A | Discharge status: Home / Self Care | Payer: BC Managed Care – PPO | Source: Home / Self Care | Attending: Family Medicine | Admitting: Family Medicine

## 2014-02-20 ENCOUNTER — Encounter (HOSPITAL_COMMUNITY): Payer: Self-pay | Admitting: Emergency Medicine

## 2014-02-20 DIAGNOSIS — B86 Scabies: Secondary | ICD-10-CM

## 2014-02-20 MED ORDER — PERMETHRIN 5 % EX CREA: TOPICAL_CREAM | CUTANEOUS | Status: DC

## 2014-02-20 MED ORDER — HYDROXYZINE HCL 10 MG PO TABS: 10.0000 mg | ORAL_TABLET | Freq: Three times a day (TID) | ORAL | Status: DC | PRN

## 2014-02-20 NOTE — ED Notes (Signed)
Pt  Reports  A  Rash  That  She  Has  Had  For    About  10  Days              She  Reports  The  Rash itches      And    That  Some  Of  Her   Significant others  Have  Similar  Symptoms  Although  Not as  Pronounced

## 2014-02-20 NOTE — Discharge Instructions (Signed)
Scabies  Scabies are small bugs (mites) that burrow under the skin and cause red bumps and severe itching. These bugs can only be seen with a microscope. Scabies are highly contagious. They can spread easily from person to person by direct contact. They are also spread through sharing clothing or linens that have the scabies mites living in them. It is not unusual for an entire family to become infected through shared towels, clothing, or bedding.   HOME CARE INSTRUCTIONS   · Your caregiver may prescribe a cream or lotion to kill the mites. If cream is prescribed, massage the cream into the entire body from the neck to the bottom of both feet. Also massage the cream into the scalp and face if your child is less than 1 year old. Avoid the eyes and mouth. Do not wash your hands after application.  · Leave the cream on for 8 to 12 hours. Your child should bathe or shower after the 8 to 12 hour application period. Sometimes it is helpful to apply the cream to your child right before bedtime.  · One treatment is usually effective and will eliminate approximately 95% of infestations. For severe cases, your caregiver may decide to repeat the treatment in 1 week. Everyone in your household should be treated with one application of the cream.  · New rashes or burrows should not appear within 24 to 48 hours after successful treatment. However, the itching and rash may last for 2 to 4 weeks after successful treatment. Your caregiver may prescribe a medicine to help with the itching or to help the rash go away more quickly.  · Scabies can live on clothing or linens for up to 3 days. All of your child's recently used clothing, towels, stuffed toys, and bed linens should be washed in hot water and then dried in a dryer for at least 20 minutes on high heat. Items that cannot be washed should be enclosed in a plastic bag for at least 3 days.  · To help relieve itching, bathe your child in a cool bath or apply cool washcloths to the  affected areas.  · Your child may return to school after treatment with the prescribed cream.  SEEK MEDICAL CARE IF:   · The itching persists longer than 4 weeks after treatment.  · The rash spreads or becomes infected. Signs of infection include red blisters or yellow-tan crust.  Document Released: 11/22/2005 Document Revised: 02/14/2012 Document Reviewed: 04/02/2009  [employer]® Patient Information ©2014 [employer].

## 2014-02-20 NOTE — ED Provider Notes (Signed)
CSN: [id number]     Arrival date & time 02/20/14  1222 History   First MD Initiated Contact with Patient 02/20/14 1310     Chief Complaint  Patient presents with  . Rash   (Consider location/radiation/quality/duration/timing/severity/associated sxs/prior Treatment) Patient is a 38 y.o. female presenting with rash. The history is provided by the patient.  Rash Location: Began at left axilla and spread to torso. 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 No pertinent past medical history   . Sciatica    Past Surgical History  Procedure Laterality Date  . Dilation and curettage of uterus    . 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 Laparoscopy  05/26/2012    Procedure: LAPAROSCOPY OPERATIVE;  Surgeon: Mora Bellman, MD;  Location: [facility];  Service: Gynecology;  Laterality: N/A;  Operative Laparoscopy with Right Salpingectomy   Family History  Problem Relation Age of Onset  . Other Neg Hx    History  Substance Use Topics  . Smoking status: Current Some Day Smoker -- 0.25 packs/day    Types: Cigarettes  . Smokeless tobacco: Not on file  . Alcohol Use: No   OB History   Grav Para Term Preterm Abortions TAB SAB Ect Mult Living   4 2 2  0 1 1 0 0 0 2     Review of Systems  Constitutional: Negative for fever and fatigue.  HENT: Negative for sore throat.   Respiratory: Negative for shortness of breath and wheezing.    Gastrointestinal: Negative for nausea, vomiting, abdominal pain and diarrhea.  Musculoskeletal: Negative for arthralgias and myalgias.  Skin: Positive for rash.  Neurological: Negative for headaches.  All other systems reviewed and are negative.    Allergies  Review of patient's allergies indicates no known allergies.  Home Medications   Current Outpatient Rx  Name  Route  Sig  Dispense  Refill  . cyclobenzaprine (FLEXERIL) 10 MG tablet   Oral   Take 1 tablet (10 mg total) by mouth 3 (three) times daily as needed for muscle spasms.   15 tablet   0   . HYDROcodone-acetaminophen (NORCO/VICODIN) 5-325 MG per tablet   Oral   Take 1 tablet by mouth every 6 (six) hours as needed for moderate pain.   15 tablet   0   . EXPIRED: norgestimate-ethinyl estradiol (ORTHO-CYCLEN,SPRINTEC,PREVIFEM) 0.25-35 MG-MCG tablet   Oral   Take 1 tablet by mouth daily.   1 Package   11   . permethrin (ELIMITE) 5 % cream      Apply to affected area once, leave 8 hours then shower. Repeat same treatment one week later   60 g   0   . predniSONE (DELTASONE) 50 MG tablet   Oral   Take 1 tablet (50 mg total) by mouth daily.   5 tablet   0    BP 113/80  Pulse 78  Temp(Src)  97.8 F (36.6 C) (Oral)  Resp 18  SpO2 96%  LMP 02/03/2014 Physical Exam  Nursing note and vitals reviewed. Constitutional: She is oriented to person, place, and time. She appears well-developed and well-nourished. No distress.  HENT:  Head: Normocephalic and atraumatic.  Right Ear: Hearing and external ear normal.  Left Ear: Hearing and external ear normal.  Nose: Nose normal.  Mouth/Throat: Oropharynx is clear and moist.  Eyes: Conjunctivae are normal. No scleral icterus.  Cardiovascular: Normal rate.   Pulmonary/Chest: Effort normal.  Abdominal: There is no tenderness.  Musculoskeletal: Normal range of motion.  Neurological: She is alert and oriented to person, place, and time.  Skin: Skin is warm and dry.  Rash noted.  Wide spread erythematous papular rash most heavily concentrated at left axilla and around base of neck. Lesions on torso, inner thighs and upper extremities  Psychiatric: She has a normal mood and affect. Her behavior is normal.    ED Course  Procedures (including critical care time) Labs Review Labs Reviewed - No data to display Imaging Review No results found.   MDM   1. Scabies    Scabies: Elimite as prescribed. Advised mother to have her daughter evaluated and treated.   [place], [place] 02/20/14 1339

## 2014-02-22 NOTE — ED Provider Notes (Signed)
Medical screening examination/treatment/procedure(s) were performed by a resident physician or non-physician practitioner and as the supervising physician I was immediately available for consultation/collaboration.  Lynne Leader, MD    Gregor Hams, MD 02/22/14 [id number]

## 2014-03-03 ENCOUNTER — Ambulatory Visit (HOSPITAL_COMMUNITY)
Admission: EM | Admit: 2014-03-03 | Discharge: 2014-03-03 | Disposition: A | Discharge status: Home / Self Care | Payer: BC Managed Care – PPO | Attending: Emergency Medicine | Admitting: Emergency Medicine

## 2014-03-03 ENCOUNTER — Encounter (HOSPITAL_COMMUNITY): Payer: Self-pay | Admitting: Emergency Medicine

## 2014-03-03 DIAGNOSIS — B86 Scabies: Secondary | ICD-10-CM | POA: Insufficient documentation

## 2014-03-03 DIAGNOSIS — Z8619 Personal history of other infectious and parasitic diseases: Secondary | ICD-10-CM

## 2014-03-03 NOTE — ED Provider Notes (Signed)
Medical screening examination/treatment/procedure(s) were performed by non-physician practitioner and as supervising physician I was immediately available for consultation/collaboration.     Maikol Grassia, MD 03/03/14 2319 

## 2014-03-03 NOTE — ED Provider Notes (Signed)
CSN: [id number]     Arrival date & time 03/03/14  1935 History  This chart was scribed for non-physician practitioner, Abigail Butts, PA-C,working with Veryl Speak, MD, by Marlowe Kays, ED Scribe.  This patient was seen in room TR04C/TR04C and the patient's care was started at 7:49 PM.  Chief Complaint  Patient presents with  . here for scabies check    The history is provided by the patient and medical records. No language interpreter was used.   HPI Comments:  Renee Schroeder is a 38 y.o. female who presents to the Emergency Department needing to be rechecked after being treated for scabies four days ago. Pt states she got scabies from her job and needs to make sure that she is able to go back to work. She states the areas are getting better. She denies any issue or other problems.    Past Medical History  Diagnosis Date  . No pertinent past medical history   . Sciatica    Past Surgical History  Procedure Laterality Date  . Dilation and curettage of uterus    . 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 Laparoscopy  05/26/2012    Procedure: LAPAROSCOPY OPERATIVE;  Surgeon: Mora Bellman, MD;  Location: [facility];  Service: Gynecology;  Laterality: N/A;  Operative Laparoscopy with Right Salpingectomy   Family History  Problem Relation Age of Onset  . Other Neg Hx    History  Substance Use Topics  . Smoking status: Current Some Day Smoker -- 0.25 packs/day    Types: Cigarettes  . Smokeless tobacco: Not on file  . Alcohol Use: No   OB History   Grav Para Term Preterm Abortions TAB SAB Ect Mult Living   4 2 2  0 1 1 0 0 0 2     Review of Systems  Constitutional: Negative for fever and chills.  Gastrointestinal: Negative for nausea and vomiting.  Skin: Positive for rash.  Allergic/Immunologic: Negative for immunocompromised state.  Hematological: Negative for adenopathy.    Allergies  Review of patient's allergies indicates no known  allergies.  Home Medications   Current Outpatient Rx  Name  Route  Sig  Dispense  Refill  . cyclobenzaprine (FLEXERIL) 10 MG tablet   Oral   Take 1 tablet (10 mg total) by mouth 3 (three) times daily as needed for muscle spasms.   15 tablet   0   . HYDROcodone-acetaminophen (NORCO/VICODIN) 5-325 MG per tablet   Oral   Take 1 tablet by mouth every 6 (six) hours as needed for moderate pain.   15 tablet   0   . hydrOXYzine (ATARAX/VISTARIL) 10 MG tablet   Oral   Take 1 tablet (10 mg total) by mouth 3 (three) times daily as needed for itching.   30 tablet   0   . EXPIRED: norgestimate-ethinyl estradiol (ORTHO-CYCLEN,SPRINTEC,PREVIFEM) 0.25-35 MG-MCG tablet   Oral   Take 1 tablet by mouth daily.   1 Package   11   . permethrin (ELIMITE) 5 % cream      Apply to affected area once, leave 8 hours then shower. Repeat same treatment one week later   60 g   0   . predniSONE (DELTASONE) 50 MG tablet   Oral   Take 1 tablet (50 mg total) by mouth daily.   5 tablet   0    Triage Vitals: BP 122/79  Pulse 72  Temp(Src) 98.6 F (37  C)  Resp 18  Ht 5\' 9"  (1.753 m)  Wt 195 lb (88.451 kg)  BMI 28.78 kg/m2  SpO2 98%  LMP 02/03/2014 Physical Exam  Nursing note and vitals reviewed. Constitutional: She appears well-developed and well-nourished. No distress.  Awake, alert, nontoxic appearance  HENT:  Head: Normocephalic and atraumatic.  Mouth/Throat: Oropharynx is clear and moist. No oropharyngeal exudate.  Eyes: Conjunctivae are normal. No scleral icterus.  Neck: Normal range of motion.  Cardiovascular: Normal rate, regular rhythm, normal heart sounds and intact distal pulses.   No murmur heard. Pulmonary/Chest: Effort normal and breath sounds normal. No respiratory distress. She has no wheezes.  Neurological: She is alert.  Speech is clear and goal oriented Moves extremities without ataxia  Skin: Skin is warm and dry. Rash noted. She is not diaphoretic.  Healing mildly  erythematous papules with some excoriations rash consistent with scabies located along left chest and left flank. None present on face.   Psychiatric: She has a normal mood and affect.    ED Course  Procedures (including critical care time) DIAGNOSTIC STUDIES: Oxygen Saturation is 98% on RA, normal by my interpretation.   COORDINATION OF CARE: 7:51 PM- Will give note to return to work in three days. Pt verbalizes understanding and agrees to plan.  Medications - No data to display  Labs Review Labs Reviewed - No data to display Imaging Review No results found.   EKG Interpretation None      MDM   Final diagnoses:  H/O scabies   Renee Schroeder presents with healing rash consistent with scabies; no new lesions. Pt used the permethrin 4 days ago.  She may return to work in 3 days.  I will provide her with a note.  No evidence of secondary infection.    It has been determined that no acute conditions requiring further emergency intervention are present at this time. The patient/guardian have been advised of the diagnosis and plan. We have discussed signs and symptoms that warrant return to the ED, such as changes or worsening in symptoms.   Vital signs are stable at discharge.   BP 120/72  Pulse 64  Temp(Src) 98.6 F (37 C) (Oral)  Resp 18  Ht 5\' 9"  (1.753 m)  Wt 195 lb (88.451 kg)  BMI 28.78 kg/m2  SpO2 100%  LMP 02/03/2014  Patient/guardian has voiced understanding and agreed to follow-up with the PCP or specialist.  I personally performed the services described in this documentation, which was scribed in my presence. The recorded information has been reviewed and is accurate.    Jarrett Soho Geanie Pacifico, PA-C 03/03/14 2211

## 2014-03-03 NOTE — ED Notes (Signed)
The p was diagnosed with scabies on the 20th of the month.  She did not get the meds as soon as she was diagnosed and her job has sent her here to make sure the scabies are gone

## 2014-03-03 NOTE — Discharge Instructions (Signed)
1. Medications: usual home medications 2. Treatment: rest, drink plenty of fluids,  3. Follow Up: Please followup with your primary doctor for discussion of your diagnoses and further evaluation after today's visit; if you do not have a primary care doctor use the resource guide provided to find one;   Scabies Scabies are small bugs (mites) that burrow under the skin and cause red bumps and severe itching. These bugs can only be seen with a microscope. Scabies are highly contagious. They can spread easily from person to person by direct contact. They are also spread through sharing clothing or linens that have the scabies mites living in them. It is not unusual for an entire family to become infected through shared towels, clothing, or bedding.  HOME CARE INSTRUCTIONS   Your caregiver may prescribe a cream or lotion to kill the mites. If cream is prescribed, massage the cream into the entire body from the neck to the bottom of both feet. Also massage the cream into the scalp and face if your child is less than 3 year old. Avoid the eyes and mouth. Do not wash your hands after application.  Leave the cream on for 8 to 12 hours. Your child should bathe or shower after the 8 to 12 hour application period. Sometimes it is helpful to apply the cream to your child right before bedtime.  One treatment is usually effective and will eliminate approximately 95% of infestations. For severe cases, your caregiver may decide to repeat the treatment in 1 week. Everyone in your household should be treated with one application of the cream.  New rashes or burrows should not appear within 24 to 48 hours after successful treatment. However, the itching and rash may last for 2 to 4 weeks after successful treatment. Your caregiver may prescribe a medicine to help with the itching or to help the rash go away more quickly.  Scabies can live on clothing or linens for up to 3 days. All of your child's recently used clothing,  towels, stuffed toys, and bed linens should be washed in hot water and then dried in a dryer for at least 20 minutes on high heat. Items that cannot be washed should be enclosed in a plastic bag for at least 3 days.  To help relieve itching, bathe your child in a cool bath or apply cool washcloths to the affected areas.  Your child may return to school after treatment with the prescribed cream. SEEK MEDICAL CARE IF:   The itching persists longer than 4 weeks after treatment.  The rash spreads or becomes infected. Signs of infection include red blisters or yellow-tan crust. Document Released: 11/22/2005 Document Revised: 02/14/2012 Document Reviewed: 04/02/2009 [facility] Patient Information 2014 [employer].

## 2014-10-07 ENCOUNTER — Encounter (HOSPITAL_COMMUNITY): Payer: Self-pay | Admitting: Emergency Medicine

## 2015-03-03 ENCOUNTER — Other Ambulatory Visit: Payer: Self-pay | Admitting: Obstetrics and Gynecology

## 2015-03-03 DIAGNOSIS — Z1231 Encounter for screening mammogram for malignant neoplasm of breast: Secondary | ICD-10-CM

## 2015-03-11 ENCOUNTER — Inpatient Hospital Stay (HOSPITAL_COMMUNITY)
Admission: AD | Admit: 2015-03-11 | Discharge: 2015-03-11 | Disposition: A | Discharge status: Home / Self Care | Payer: Self-pay | Source: Ambulatory Visit | Attending: Family Medicine | Admitting: Family Medicine

## 2015-03-11 ENCOUNTER — Encounter (HOSPITAL_COMMUNITY): Payer: Self-pay | Admitting: Emergency Medicine

## 2015-03-11 DIAGNOSIS — N760 Acute vaginitis: Secondary | ICD-10-CM | POA: Insufficient documentation

## 2015-03-11 DIAGNOSIS — N938 Other specified abnormal uterine and vaginal bleeding: Secondary | ICD-10-CM | POA: Insufficient documentation

## 2015-03-11 DIAGNOSIS — N390 Urinary tract infection, site not specified: Secondary | ICD-10-CM | POA: Insufficient documentation

## 2015-03-11 DIAGNOSIS — N3000 Acute cystitis without hematuria: Secondary | ICD-10-CM

## 2015-03-11 DIAGNOSIS — A499 Bacterial infection, unspecified: Secondary | ICD-10-CM

## 2015-03-11 DIAGNOSIS — B9689 Other specified bacterial agents as the cause of diseases classified elsewhere: Secondary | ICD-10-CM | POA: Insufficient documentation

## 2015-03-11 DIAGNOSIS — F17210 Nicotine dependence, cigarettes, uncomplicated: Secondary | ICD-10-CM | POA: Insufficient documentation

## 2015-03-11 HISTORY — DX: Hidradenitis suppurativa: L73.2

## 2015-03-11 LAB — URINALYSIS, ROUTINE W REFLEX MICROSCOPIC
BILIRUBIN URINE: NEGATIVE
Glucose, UA: NEGATIVE mg/dL
Ketones, ur: NEGATIVE mg/dL
NITRITE: POSITIVE — AB
PH: 5.5 (ref 5.0–8.0)
Protein, ur: NEGATIVE mg/dL
SPECIFIC GRAVITY, URINE: 1.025 (ref 1.005–1.030)
Urobilinogen, UA: 0.2 mg/dL (ref 0.0–1.0)

## 2015-03-11 LAB — CBC
HCT: 39.1 % (ref 36.0–46.0)
Hemoglobin: 13.3 g/dL (ref 12.0–15.0)
MCH: 30.4 pg (ref 26.0–34.0)
MCHC: 34 g/dL (ref 30.0–36.0)
MCV: 89.5 fL (ref 78.0–100.0)
PLATELETS: 221 10*3/uL (ref 150–400)
RBC: 4.37 MIL/uL (ref 3.87–5.11)
RDW: 14.6 % (ref 11.5–15.5)
WBC: 5 10*3/uL (ref 4.0–10.5)

## 2015-03-11 LAB — URINE MICROSCOPIC-ADD ON

## 2015-03-11 LAB — POCT PREGNANCY, URINE: Preg Test, Ur: NEGATIVE

## 2015-03-11 LAB — WET PREP, GENITAL
TRICH WET PREP: NONE SEEN
Yeast Wet Prep HPF POC: NONE SEEN

## 2015-03-11 MED ORDER — METRONIDAZOLE 500 MG PO TABS: 500.0000 mg | ORAL_TABLET | Freq: Two times a day (BID) | ORAL | Status: DC

## 2015-03-11 MED ORDER — SULFAMETHOXAZOLE-TRIMETHOPRIM 800-160 MG PO TABS: 1.0000 | ORAL_TABLET | Freq: Two times a day (BID) | ORAL | Status: DC

## 2015-03-11 MED ORDER — MEDROXYPROGESTERONE ACETATE 10 MG PO TABS: 10.0000 mg | ORAL_TABLET | Freq: Every day | ORAL | Status: DC

## 2015-03-11 NOTE — MAU Provider Note (Signed)
History     CSN: [id number]  Arrival date and time: 03/11/15 1962   None     Chief Complaint  Patient presents with  . Vaginal Bleeding  . Abdominal Pain   Vaginal Bleeding The patient's primary symptoms include vaginal bleeding. The patient's pertinent negatives include no genital itching, genital lesions or missed menses. This is a new problem. The current episode started 1 to 4 weeks ago. The problem occurs constantly. The problem has been unchanged. The pain is mild. The problem affects both sides. She is not pregnant. Associated symptoms include abdominal pain (mild) and chills. Pertinent negatives include no constipation, diarrhea, dysuria, fever, flank pain, frequency, headaches, nausea or vomiting. The vaginal discharge was bloody. The vaginal bleeding is heavier than menses. She has been passing clots. She has not been passing tissue. She is sexually active. No, her partner does not have an STD. She uses nothing for contraception. Her menstrual history has been regular. There is no history of a gynecological surgery.   This is a 39 y.o. female who presents with c/o prolonged menses. States has always had normal cycles and has never had one last more than 4 days. States this period lasted 4 days, then stopped then a few days later started having heavy bleeding again. Has a few mild cramps.   RN Note:  Expand All Collapse All   Hx of ectopic pregnancy, started period on 3/21, still bleeding now. Lower abd cramping, neg HPT last night.          OB History    Gravida Para Term Preterm AB TAB SAB Ectopic Multiple Living   5 2 2  0 2 1 0 1 0 2      Past Medical History  Diagnosis Date  . No pertinent past medical history   . Sciatica   . Hydradenitis     Past Surgical History  Procedure Laterality Date  . Dilation and curettage of uterus    . Laparoscopy  05/26/2012    Procedure: LAPAROSCOPY OPERATIVE;  Surgeon: Mora Bellman, MD;  Location: [facility];  Service:  Gynecology;  Laterality: N/A;  Operative Laparoscopy with Right Salpingectomy  . Sweat glands removed from rt. under arm Right     Family History  Problem Relation Age of Onset  . Other Neg Hx     History  Substance Use Topics  . Smoking status: Current Some Day Smoker -- 0.50 packs/day    Types: Cigarettes  . Smokeless tobacco: Not on file  . Alcohol Use: No    Allergies: No Known Allergies  Prescriptions prior to admission  Medication Sig Dispense Refill Last Dose  . cyclobenzaprine (FLEXERIL) 10 MG tablet Take 1 tablet (10 mg total) by mouth 3 (three) times daily as needed for muscle spasms. (Patient not taking: Reported on 03/11/2015) 15 tablet 0   . HYDROcodone-acetaminophen (NORCO/VICODIN) 5-325 MG per tablet Take 1 tablet by mouth every 6 (six) hours as needed for moderate pain. (Patient not taking: Reported on 03/11/2015) 15 tablet 0   . hydrOXYzine (ATARAX/VISTARIL) 10 MG tablet Take 1 tablet (10 mg total) by mouth 3 (three) times daily as needed for itching. (Patient not taking: Reported on 03/11/2015) 30 tablet 0   . norgestimate-ethinyl estradiol (ORTHO-CYCLEN,SPRINTEC,PREVIFEM) 0.25-35 MG-MCG tablet Take 1 tablet by mouth daily. (Patient not taking: Reported on 03/11/2015) 1 Package 11   . permethrin (ELIMITE) 5 % cream Apply to affected area once, leave 8 hours then shower. Repeat same treatment one week later (Patient not  taking: Reported on 03/11/2015) 60 g 0   . predniSONE (DELTASONE) 50 MG tablet Take 1 tablet (50 mg total) by mouth daily. (Patient not taking: Reported on 03/11/2015) 5 tablet 0     Review of Systems  Constitutional: Positive for chills. Negative for fever and malaise/fatigue.  Gastrointestinal: Positive for abdominal pain (mild). Negative for nausea, vomiting, diarrhea and constipation.  Genitourinary: Positive for vaginal bleeding. Negative for dysuria, frequency, flank pain and missed menses.  Musculoskeletal: Negative for myalgias.  Neurological: Negative  for dizziness, weakness and headaches.   Physical Exam   Blood pressure 123/78, pulse 68, temperature 98.4 F (36.9 C), temperature source Oral, resp. rate 16, height 5\' 8"  (1.727 m), weight 196 lb 3.2 oz (88.996 kg), last menstrual period 02/24/2015, unknown if currently breastfeeding.  Physical Exam  Constitutional: She is oriented to person, place, and time. She appears well-developed and well-nourished. No distress.  HENT:  Head: Normocephalic.  Cardiovascular: Normal rate and regular rhythm.   Respiratory: Effort normal and breath sounds normal. No respiratory distress.  GI: Soft. Bowel sounds are normal. She exhibits no distension and no mass. There is no tenderness. There is no rebound and no guarding.  Genitourinary: Vaginal discharge (moderate amount of dark blood, cervix closed, no CMT) found.  Uterus small and firm, nontender   Musculoskeletal: Normal range of motion.  Neurological: She is alert and oriented to person, place, and time.  Skin: Skin is warm and dry.  Psychiatric: She has a normal mood and affect. Her behavior is normal.    MAU Course  Procedures  MDM Results for orders placed or performed during the hospital encounter of 03/11/15 (from the past 24 hour(s))  Urinalysis, Routine w reflex microscopic     Status: Abnormal   Collection Time: 03/11/15  9:57 AM  Result Value Ref Range   Color, Urine YELLOW YELLOW   APPearance CLEAR CLEAR   Specific Gravity, Urine 1.025 1.005 - 1.030   pH 5.5 5.0 - 8.0   Glucose, UA NEGATIVE NEGATIVE mg/dL   Hgb urine dipstick LARGE (A) NEGATIVE   Bilirubin Urine NEGATIVE NEGATIVE   Ketones, ur NEGATIVE NEGATIVE mg/dL   Protein, ur NEGATIVE NEGATIVE mg/dL   Urobilinogen, UA 0.2 0.0 - 1.0 mg/dL   Nitrite POSITIVE (A) NEGATIVE   Leukocytes, UA SMALL (A) NEGATIVE  Urine microscopic-add on     Status: Abnormal   Collection Time: 03/11/15  9:57 AM  Result Value Ref Range   Squamous Epithelial / LPF FEW (A) RARE   WBC, UA  21-50 <3 WBC/hpf   RBC / HPF 3-6 <3 RBC/hpf   Bacteria, UA MANY (A) RARE   Urine-Other MUCOUS PRESENT   Pregnancy, urine POC     Status: None   Collection Time: 03/11/15  9:59 AM  Result Value Ref Range   Preg Test, Ur NEGATIVE NEGATIVE  Wet prep, genital     Status: Abnormal   Collection Time: 03/11/15 10:17 AM  Result Value Ref Range   Yeast Wet Prep HPF POC NONE SEEN NONE SEEN   Trich, Wet Prep NONE SEEN NONE SEEN   Clue Cells Wet Prep HPF POC MODERATE (A) NONE SEEN   WBC, Wet Prep HPF POC FEW (A) NONE SEEN  CBC     Status: None   Collection Time: 03/11/15 10:30 AM  Result Value Ref Range   WBC 5.0 4.0 - 10.5 K/uL   RBC 4.37 3.87 - 5.11 MIL/uL   Hemoglobin 13.3 12.0 - 15.0 g/dL  HCT 39.1 36.0 - 46.0 %   MCV 89.5 78.0 - 100.0 fL   MCH 30.4 26.0 - 34.0 pg   MCHC 34.0 30.0 - 36.0 g/dL   RDW 14.6 11.5 - 15.5 %   Platelets 221 150 - 400 K/uL    Assessment and Plan  A:  Dysfunctional Uterine Bleeding, probably anovulatory       Normal hemoglobin level       UTI, nitrites in urine       BV  P:  Discussed findings.        Discussed options. Did not like OCPs, made her hair fall out. WIll Rx Provera x 10 days        Rx Septra DS x 3 days       Rx Flagyl x 7 d        Followup in clinic if needed  [facility] 03/11/2015, 10:06 AM

## 2015-03-11 NOTE — Discharge Instructions (Signed)
Abnormal Uterine Bleeding Abnormal uterine bleeding can affect women at various stages in life, including teenagers, women in their reproductive years, pregnant women, and women who have reached menopause. Several kinds of uterine bleeding are considered abnormal, including:  Bleeding or spotting between periods.   Bleeding after sexual intercourse.   Bleeding that is heavier or more than normal.   Periods that last longer than usual.  Bleeding after menopause.  Many cases of abnormal uterine bleeding are minor and simple to treat, while others are more serious. Any type of abnormal bleeding should be evaluated by your health care provider. Treatment will depend on the cause of the bleeding. HOME CARE INSTRUCTIONS Monitor your condition for any changes. The following actions may help to alleviate any discomfort you are experiencing:  Avoid the use of tampons and douches as directed by your health care provider.  Change your pads frequently. You should get regular pelvic exams and Pap tests. Keep all follow-up appointments for diagnostic tests as directed by your health care provider.  SEEK MEDICAL CARE IF:   Your bleeding lasts more than 1 week.   You feel dizzy at times.  SEEK IMMEDIATE MEDICAL CARE IF:   You pass out.   You are changing pads every 15 to 30 minutes.   You have abdominal pain.  You have a fever.   You become sweaty or weak.   You are passing large blood clots from the vagina.   You start to feel nauseous and vomit. MAKE SURE YOU:   Understand these instructions.  Will watch your condition.  Will get help right away if you are not doing well or get worse. Document Released: 11/22/2005 Document Revised: 11/27/2013 Document Reviewed: 06/21/2013 ExitCare Patient Information 2015 [employer]. This information is not intended to replace advice given to you by your health care provider. Make sure you discuss any questions you have with your  health care provider.  

## 2015-03-11 NOTE — MAU Note (Signed)
Hx of ectopic pregnancy, started period on 3/21, still bleeding now.  Lower abd cramping, neg HPT last night.

## 2015-03-12 LAB — HIV ANTIBODY (ROUTINE TESTING W REFLEX): HIV Screen 4th Generation wRfx: NONREACTIVE

## 2015-03-12 LAB — GC/CHLAMYDIA PROBE AMP (~~LOC~~) NOT AT ARMC
Chlamydia: NEGATIVE
NEISSERIA GONORRHEA: NEGATIVE

## 2015-04-03 ENCOUNTER — Ambulatory Visit (HOSPITAL_COMMUNITY): Payer: Self-pay

## 2015-04-03 ENCOUNTER — Ambulatory Visit (HOSPITAL_COMMUNITY): Payer: Self-pay | Attending: Obstetrics and Gynecology

## 2016-01-19 ENCOUNTER — Emergency Department (INDEPENDENT_AMBULATORY_CARE_PROVIDER_SITE_OTHER)
Admission: EM | Admit: 2016-01-19 | Discharge: 2016-01-19 | Disposition: A | Discharge status: Home / Self Care | Payer: Self-pay | Source: Home / Self Care | Attending: Emergency Medicine | Admitting: Emergency Medicine

## 2016-01-19 ENCOUNTER — Encounter (HOSPITAL_COMMUNITY): Payer: Self-pay | Admitting: Emergency Medicine

## 2016-01-19 DIAGNOSIS — J111 Influenza due to unidentified influenza virus with other respiratory manifestations: Secondary | ICD-10-CM

## 2016-01-19 NOTE — Discharge Instructions (Signed)
Influenza, Adult °Influenza ("the flu") is a viral infection of the respiratory tract. It occurs more often in winter months because people spend more time in close contact with one another. Influenza can make you feel very sick. Influenza easily spreads from person to person (contagious). °CAUSES  °Influenza is caused by a virus that infects the respiratory tract. You can catch the virus by breathing in droplets from an infected person's cough or sneeze. You can also catch the virus by touching something that was recently contaminated with the virus and then touching your mouth, nose, or eyes. °RISKS AND COMPLICATIONS °You may be at risk for a more severe case of influenza if you smoke cigarettes, have diabetes, have chronic heart disease (such as heart failure) or lung disease (such as asthma), or if you have a weakened immune system. Elderly people and pregnant women are also at risk for more serious infections. The most common problem of influenza is a lung infection (pneumonia). Sometimes, this problem can require emergency medical care and may be life threatening. °SIGNS AND SYMPTOMS  °Symptoms typically last 4 to 10 days and may include: °· Fever. °· Chills. °· Headache, body aches, and muscle aches. °· Sore throat. °· Chest discomfort and cough. °· Poor appetite. °· Weakness or feeling tired. °· Dizziness. °· Nausea or vomiting. °DIAGNOSIS  °Diagnosis of influenza is often made based on your history and a physical exam. A nose or throat swab test can be done to confirm the diagnosis. °TREATMENT  °In mild cases, influenza goes away on its own. Treatment is directed at relieving symptoms. For more severe cases, your health care provider may prescribe antiviral medicines to shorten the sickness. Antibiotic medicines are not effective because the infection is caused by a virus, not by bacteria. °HOME CARE INSTRUCTIONS °· Take medicines only as directed by your health care provider. °· Use a cool mist humidifier  to make breathing easier. °· Get plenty of rest until your temperature returns to normal. This usually takes 3 to 4 days. °· Drink enough fluid to keep your urine clear or pale yellow. °· Cover your mouth and nose when coughing or sneezing, and wash your hands well to prevent the virus from spreading. °· Stay home from work or school until the fever is gone for at least 1 full day. °PREVENTION  °An annual influenza vaccination (flu shot) is the best way to avoid getting influenza. An annual flu shot is now routinely recommended for all adults in the [place] °SEEK MEDICAL CARE IF: °· You experience chest pain, your cough worsens, or you produce more mucus. °· You have nausea, vomiting, or diarrhea. °· Your fever returns or gets worse. °SEEK IMMEDIATE MEDICAL CARE IF: °· You have trouble breathing, you become short of breath, or your skin or nails become bluish. °· You have severe pain or stiffness in the neck. °· You develop a sudden headache, or pain in the face or ear. °· You have nausea or vomiting that you cannot control. °MAKE SURE YOU:  °· Understand these instructions. °· Will watch your condition. °· Will get help right away if you are not doing well or get worse. °  °This information is not intended to replace advice given to you by your health care provider. Make sure you discuss any questions you have with your health care provider. °  °Document Released: 11/19/2000 Document Revised: 12/13/2014 Document Reviewed: 02/21/2012 °Elsevier Interactive Patient Education ©2016 [employer] ° °Cough, Adult °Coughing is a reflex that clears your throat and your airways. Coughing helps to heal and protect your lungs. It is normal to cough occasionally, but a cough that happens with other   symptoms or lasts a long time may be a sign of a condition that needs treatment. A cough may last only 2-3 weeks (acute), or it may last longer than 8 weeks (chronic). °CAUSES °Coughing is commonly caused by: °· Breathing in substances  that irritate your lungs. °· A viral or bacterial respiratory infection. °· Allergies. °· Asthma. °· Postnasal drip. °· Smoking. °· Acid backing up from the stomach into the esophagus (gastroesophageal reflux). °· Certain medicines. °· Chronic lung problems, including COPD (or rarely, lung cancer). °· Other medical conditions such as heart failure. °HOME CARE INSTRUCTIONS  °Pay attention to any changes in your symptoms. Take these actions to help with your discomfort: °· Take medicines only as told by your health care provider. °¨ If you were prescribed an antibiotic medicine, take it as told by your health care provider. Do not stop taking the antibiotic even if you start to feel better. °¨ Talk with your health care provider before you take a cough suppressant medicine. °· Drink enough fluid to keep your urine clear or pale yellow. °· If the air is dry, use a cold steam vaporizer or humidifier in your bedroom or your home to help loosen secretions. °· Avoid anything that causes you to cough at work or at home. °· If your cough is worse at night, try sleeping in a semi-upright position. °· Avoid cigarette smoke. If you smoke, quit smoking. If you need help quitting, ask your health care provider. °· Avoid caffeine. °· Avoid alcohol. °· Rest as needed. °SEEK MEDICAL CARE IF:  °· You have new symptoms. °· You cough up pus. °· Your cough does not get better after 2-3 weeks, or your cough gets worse. °· You cannot control your cough with suppressant medicines and you are losing sleep. °· You develop pain that is getting worse or pain that is not controlled with pain medicines. °· You have a fever. °· You have unexplained weight loss. °· You have night sweats. °SEEK IMMEDIATE MEDICAL CARE IF: °· You cough up blood. °· You have difficulty breathing. °· Your heartbeat is very fast. °  °This information is not intended to replace advice given to you by your health care provider. Make sure you discuss any questions you have  with your health care provider. °  °Document Released: 05/21/2011 Document Revised: 08/13/2015 Document Reviewed: 01/29/2015 °Elsevier Interactive Patient Education ©2016 Elsevier [employer] ° °

## 2016-01-19 NOTE — ED Notes (Signed)
The patient presented to the [facility] with a complaint of a cough and chills x 2 days. The patient reported that her daughter was just diagnosed with the flu.

## 2016-01-19 NOTE — ED Provider Notes (Signed)
CSN: [id number]     Arrival date & time 01/19/16  1619 History   First MD Initiated Contact with Patient 01/19/16 1910     Chief Complaint  Patient presents with  . Chills  . Cough   (Consider location/radiation/quality/duration/timing/severity/associated sxs/prior Treatment) HPI History obtained from patient: Pt has been ill for 5 days. Chills, fever, nausea body aches. Using OTC meds without relief. No flu injection.  Past Medical History  Diagnosis Date  . No pertinent past medical history   . Sciatica   . Hydradenitis    Past Surgical History  Procedure Laterality Date  . Dilation and curettage of uterus    . Laparoscopy  05/26/2012    Procedure: LAPAROSCOPY OPERATIVE;  Surgeon: Mora Bellman, MD;  Location: [facility];  Service: Gynecology;  Laterality: N/A;  Operative Laparoscopy with Right Salpingectomy  . Sweat glands removed from rt. under arm Right    Family History  Problem Relation Age of Onset  . Other Neg Hx    Social History  Substance Use Topics  . Smoking status: Current Some Day Smoker -- 0.50 packs/day    Types: Cigarettes  . Smokeless tobacco: None  . Alcohol Use: No   OB History    Gravida Para Term Preterm AB TAB SAB Ectopic Multiple Living   5 2 2  0 2 1 0 1 0 2     Review of Systems ROS +'vebody aches, fever, chills  Denies: HEADACHE,  ABDOMINAL PAIN, CHEST PAIN, CONGESTION, DYSURIA, SHORTNESS OF BREATH  Allergies  Review of patient's allergies indicates no known allergies.  Home Medications   Prior to Admission medications   Medication Sig Start Date End Date Taking? Authorizing Provider  cyclobenzaprine (FLEXERIL) 10 MG tablet Take 1 tablet (10 mg total) by mouth 3 (three) times daily as needed for muscle spasms. Patient not taking: Reported on 03/11/2015 01/30/14   Dalia Heading, PA-C  HYDROcodone-acetaminophen (NORCO/VICODIN) 5-325 MG per tablet Take 1 tablet by mouth every 6 (six) hours as needed for moderate pain. Patient not taking:  Reported on 03/11/2015 01/30/14   Dalia Heading, PA-C  hydrOXYzine (ATARAX/VISTARIL) 10 MG tablet Take 1 tablet (10 mg total) by mouth 3 (three) times daily as needed for itching. Patient not taking: Reported on 03/11/2015 02/20/14   Lutricia Feil, PA  medroxyPROGESTERone (PROVERA) 10 MG tablet Take 1 tablet (10 mg total) by mouth daily. Use for ten days 03/11/15   Seabron Spates, CNM  metroNIDAZOLE (FLAGYL) 500 MG tablet Take 1 tablet (500 mg total) by mouth 2 (two) times daily. 03/11/15   Seabron Spates, CNM  norgestimate-ethinyl estradiol (ORTHO-CYCLEN,SPRINTEC,PREVIFEM) 0.25-35 MG-MCG tablet Take 1 tablet by mouth daily. Patient not taking: Reported on 03/11/2015 06/15/12 06/15/13  Mora Bellman, MD  permethrin (ELIMITE) 5 % cream Apply to affected area once, leave 8 hours then shower. Repeat same treatment one week later Patient not taking: Reported on 03/11/2015 02/20/14   Audelia Hives Presson, PA  predniSONE (DELTASONE) 50 MG tablet Take 1 tablet (50 mg total) by mouth daily. Patient not taking: Reported on 03/11/2015 01/30/14   Dalia Heading, PA-C  sulfamethoxazole-trimethoprim (BACTRIM DS,SEPTRA DS) 800-160 MG per tablet Take 1 tablet by mouth 2 (two) times daily. 03/11/15   Seabron Spates, CNM   Meds Ordered and Administered this Visit  Medications - No data to display  BP 114/72 mmHg  Pulse 104  Temp(Src) 99.5 F (37.5 C) (Oral)  Resp 17  SpO2 100%  LMP 01/07/2016 (Exact Date)  Breastfeeding? No No data found.   Physical Exam NURSES NOTES AND VITAL SIGNS REVIEWED. CONSTITUTIONAL: Well developed, well nourished, no acute distress HEENT: normocephalic, atraumatic, right and left TM's are normal EYES: Conjunctiva normal NECK:normal ROM, supple, no adenopathy PULMONARY:No respiratory distress, normal effort, Lungs: CTAb/l, no wheezes, or increased work of breathing CARDIOVASCULAR: RRR, no murmur ABDOMEN: soft, ND, NT, +'ve BS,  MUSCULOSKELETAL: Normal ROM of all  extremities,  SKIN: warm and dry without rash PSYCHIATRIC: Mood and affect, behavior are normal  ED Course  Procedures (including critical care time)  Labs Review Labs Reviewed - No data to display  Imaging Review No results found.   Visual Acuity Review  Right Eye Distance:   Left Eye Distance:   Bilateral Distance:    Right Eye Near:   Left Eye Near:    Bilateral Near:         MDM   1. Flu    Patient is advised to continue home symptomatic treatment.  Patient is advised that if there are new or worsening symptoms or attend the emergency department, or contact primary care provider. Instructions of care provided discharged home in stable condition. Return to work/school note provided.  THIS NOTE WAS GENERATED USING A VOICE RECOGNITION SOFTWARE PROGRAM. ALL REASONABLE EFFORTS  WERE MADE TO PROOFREAD THIS DOCUMENT FOR ACCURACY.     Konrad Felix, PA 01/20/16 [phone]

## 2016-03-13 ENCOUNTER — Emergency Department (HOSPITAL_COMMUNITY)
Admission: EM | Admit: 2016-03-13 | Discharge: 2016-03-14 | Disposition: A | Discharge status: Home / Self Care | Payer: Self-pay | Attending: Emergency Medicine | Admitting: Emergency Medicine

## 2016-03-13 ENCOUNTER — Encounter (HOSPITAL_COMMUNITY): Payer: Self-pay | Admitting: Oncology

## 2016-03-13 ENCOUNTER — Emergency Department (HOSPITAL_COMMUNITY): Payer: Self-pay

## 2016-03-13 DIAGNOSIS — Z79899 Other long term (current) drug therapy: Secondary | ICD-10-CM | POA: Insufficient documentation

## 2016-03-13 DIAGNOSIS — K611 Rectal abscess: Secondary | ICD-10-CM

## 2016-03-13 DIAGNOSIS — F17210 Nicotine dependence, cigarettes, uncomplicated: Secondary | ICD-10-CM | POA: Insufficient documentation

## 2016-03-13 DIAGNOSIS — K612 Anorectal abscess: Secondary | ICD-10-CM | POA: Insufficient documentation

## 2016-03-13 MED ORDER — ONDANSETRON HCL 4 MG/2ML IJ SOLN
4.0000 mg | Freq: Once | INTRAMUSCULAR | Status: AC
  Administered 2016-03-13: 4 mg via INTRAVENOUS
  Filled 2016-03-13: qty 2

## 2016-03-13 MED ORDER — SODIUM CHLORIDE 0.9 % IV SOLN
Freq: Once | INTRAVENOUS | Status: AC
  Administered 2016-03-13: 50 mL/h via INTRAVENOUS

## 2016-03-13 MED ORDER — HYDROMORPHONE HCL 1 MG/ML IJ SOLN
1.0000 mg | INTRAMUSCULAR | Status: DC | PRN
  Administered 2016-03-13: 1 mg via INTRAVENOUS
  Filled 2016-03-13: qty 1

## 2016-03-13 NOTE — ED Notes (Signed)
Pt reports a perirectal abscess that developed on Thursday.  Pt rates pain 10/10, throbbing in nature.  Pt states that the abscess makes her legs numb and achy.

## 2016-03-13 NOTE — ED Provider Notes (Signed)
CSN: [id number]     Arrival date & time 03/13/16  2150 History   First MD Initiated Contact with Patient 03/13/16 2249     Chief Complaint  Patient presents with  . Abscess     (Consider location/radiation/quality/duration/timing/severity/associated sxs/prior Treatment) HPI Comments: gradual onset abscess in the anus for the past 3 days No BM in 3 days Has tried OTC treatments with no relief.  Patient has a history of, with frequent small abscesses on her buttocks never one in this particular region.  Patient is a 40 y.o. female presenting with abscess. The history is provided by the patient.  Abscess Location:  Ano-genital Ano-genital abscess location:  Anus Abscess quality: fluctuance, induration and painful   Abscess quality: not draining   Duration:  3 days Progression:  Worsening Pain details:    Quality:  Pressure and burning   Severity:  Severe   Duration:  2 days   Timing:  Constant   Progression:  Worsening Chronicity:  New Relieved by:  Nothing Worsened by:  Warm compresses (sitting walkiing) Ineffective treatments:  Warm compresses and cold compresses Associated symptoms: no fever, no nausea and no vomiting   Associated symptoms comment:  Constipation    Past Medical History  Diagnosis Date  . No pertinent past medical history   . Sciatica   . Hydradenitis    Past Surgical History  Procedure Laterality Date  . Dilation and curettage of uterus    . Laparoscopy  05/26/2012    Procedure: LAPAROSCOPY OPERATIVE;  Surgeon: Mora Bellman, MD;  Location: [facility];  Service: Gynecology;  Laterality: N/A;  Operative Laparoscopy with Right Salpingectomy  . Sweat glands removed from rt. under arm Right    Family History  Problem Relation Age of Onset  . Other Neg Hx    Social History  Substance Use Topics  . Smoking status: Current Some Day Smoker -- 0.50 packs/day    Types: Cigarettes  . Smokeless tobacco: None  . Alcohol Use: No   OB History    Gravida Para  Term Preterm AB TAB SAB Ectopic Multiple Living   5 2 2  0 2 1 0 1 0 2     Review of Systems  Constitutional: Negative for fever.  Respiratory: Negative for shortness of breath.   Gastrointestinal: Positive for constipation. Negative for nausea, vomiting and abdominal pain.  All other systems reviewed and are negative.     Allergies  Review of patient's allergies indicates no known allergies.  Home Medications   Prior to Admission medications   Medication Sig Start Date End Date Taking? Authorizing Provider  ibuprofen (ADVIL,MOTRIN) 200 MG tablet Take 400 mg by mouth every 6 (six) hours as needed for moderate pain.   Yes Historical Provider, MD  Pediatric Multiple Vit-C-FA (PEDIATRIC MULTIVITAMIN) chewable tablet Chew 2 tablets by mouth daily.   Yes Historical Provider, MD  HYDROcodone-acetaminophen (NORCO/VICODIN) 5-325 MG tablet Take 1-2 tablets by mouth every 4 (four) hours as needed for moderate pain or severe pain. 03/14/16   Jackolyn Confer, MD  norgestimate-ethinyl estradiol (ORTHO-CYCLEN,SPRINTEC,PREVIFEM) 0.25-35 MG-MCG tablet Take 1 tablet by mouth daily. Patient not taking: Reported on 03/11/2015 06/15/12 06/15/13  Mora Bellman, MD  sulfamethoxazole-trimethoprim (BACTRIM DS,SEPTRA DS) 800-160 MG tablet Take 1 tablet by mouth 2 (two) times daily. 03/14/16   Jackolyn Confer, MD   BP 113/78 mmHg  Pulse 73  Temp(Src) 98 F (36.7 C) (Oral)  Resp 12  Ht 5\' 9"  (1.753 m)  Wt 86.637 kg  BMI 28.19  kg/m2  SpO2 100%  LMP 03/06/2016 (Approximate) Physical Exam  Constitutional: She appears well-developed and well-nourished.  HENT:  Head: Normocephalic.  Eyes: Pupils are equal, round, and reactive to light.  Neck: Normal range of motion.  Cardiovascular: Normal rate.   Pulmonary/Chest: Effort normal.  Abdominal: Soft. She exhibits no distension. There is no tenderness.  Genitourinary: Rectal exam shows tenderness.  3 cm x 2 cm linear fluctuant area extending into the anus.   Internal examination shows fluctuant mass with in the rectum approximately 2 cm by 1 cm  Musculoskeletal: Normal range of motion.  Neurological: She is alert.  Skin: Skin is warm and dry.  Nursing note and vitals reviewed.   ED Course  Procedures (including critical care time) Labs Review Labs Reviewed  I-STAT CHEM 8, ED - Abnormal; Notable for the following:    Glucose, Bld 113 (*)    All other components within normal limits  CBC WITH DIFFERENTIAL/PLATELET  I-STAT BETA HCG BLOOD, ED ([facility], [facility], [facility] ONLY)    Imaging Review Ct Abdomen Pelvis W Contrast  03/14/2016  CLINICAL DATA:  Perirectal abscess. Patient reports onset yesterday, throbbing rectal pain. EXAM: CT ABDOMEN AND PELVIS WITH CONTRAST TECHNIQUE: Multidetector CT imaging of the abdomen and pelvis was performed using the standard protocol following bolus administration of intravenous contrast. CONTRAST:  173mL ISOVUE-300 IOPAMIDOL (ISOVUE-300) INJECTION 61%, 92mL OMNIPAQUE IOHEXOL 300 MG/ML SOLN COMPARISON:  None. FINDINGS: Lower chest:  Dependent subsegmental atelectasis at the bases. Liver: No focal lesion. Hepatobiliary: Gallbladder physiologically distended, no calcified stone. No biliary dilatation. Pancreas: No ductal dilatation or inflammation. Spleen: Normal. Adrenal glands: No nodule. Kidneys: Symmetric renal enhancement and excretion. No hydronephrosis. Stomach/Bowel: Stomach physiologically distended. There are no dilated or thickened small bowel loops. Moderate stool burden in the right colon. Small to moderate stool throughout the remainder the colon. The appendix is normal. Right lateral perirectal fluid collection measures 3.6 x 2.0 x 4.0 cm with internal septations and adjacent soft tissue stranding. Mild edema about the distal pericolonic fat of the sigmoid colon. There is a 7 mm perisigmoid lymph node. Vascular/Lymphatic: Prominent bilateral inguinal lymph nodes. Enlarged right external iliac node measures 12 mm short axis.  Enlarged right internal iliac lymph nodes. Abdominal aorta is normal in caliber. Reproductive: Uterus is retroverted with probable anterior fibroid. Ovaries symmetric in size. Bladder: Physiologically distended without wall thickening. Other: No free air, free fluid, or intra-abdominal fluid collection. Musculoskeletal: There are no acute or suspicious osseous abnormalities. IMPRESSION: 1. Right perirectal abscess measuring 3.6 x 2.0 x 4.0 cm. 2. Right internal and external iliac adenopathy is likely reactive. 3. Uterine fibroids. Electronically Signed   By: Jeb Levering M.D.   On: 03/14/2016 01:24   I have personally reviewed and evaluated these images and lab results as part of my medical decision-making.   EKG Interpretation None     After examining patient.  Will start IV team.  CBC i-STAT and CT abdomen for further evaluation of perirectal abscess MDM   Final diagnoses:  Perirectal abscess         Junius Creamer, NP 03/14/16 2034  Veatrice Kells, MD 03/14/16 2318

## 2016-03-14 ENCOUNTER — Emergency Department (HOSPITAL_COMMUNITY): Payer: Self-pay | Admitting: Anesthesiology

## 2016-03-14 ENCOUNTER — Encounter (HOSPITAL_COMMUNITY)
Admission: EM | Disposition: A | Discharge status: Home / Self Care | Payer: Self-pay | Source: Home / Self Care | Attending: Emergency Medicine

## 2016-03-14 ENCOUNTER — Encounter (HOSPITAL_COMMUNITY): Payer: Self-pay | Admitting: Anesthesiology

## 2016-03-14 ENCOUNTER — Telehealth: Payer: Self-pay

## 2016-03-14 HISTORY — PX: IRRIGATION AND DEBRIDEMENT ABSCESS: SHX5252

## 2016-03-14 LAB — CBC WITH DIFFERENTIAL/PLATELET
BASOS ABS: 0 10*3/uL (ref 0.0–0.1)
BASOS PCT: 0 %
EOS ABS: 0.1 10*3/uL (ref 0.0–0.7)
EOS PCT: 1 %
HCT: 39.7 % (ref 36.0–46.0)
Hemoglobin: 13.1 g/dL (ref 12.0–15.0)
LYMPHS ABS: 1.5 10*3/uL (ref 0.7–4.0)
LYMPHS PCT: 16 %
MCH: 29.4 pg (ref 26.0–34.0)
MCHC: 33 g/dL (ref 30.0–36.0)
MCV: 89 fL (ref 78.0–100.0)
MONO ABS: 0.8 10*3/uL (ref 0.1–1.0)
Monocytes Relative: 8 %
Neutro Abs: 7.2 10*3/uL (ref 1.7–7.7)
Neutrophils Relative %: 75 %
PLATELETS: 274 10*3/uL (ref 150–400)
RBC: 4.46 MIL/uL (ref 3.87–5.11)
RDW: 14.6 % (ref 11.5–15.5)
WBC: 9.6 10*3/uL (ref 4.0–10.5)

## 2016-03-14 LAB — I-STAT CHEM 8, ED
BUN: 11 mg/dL (ref 6–20)
CALCIUM ION: 1.15 mmol/L (ref 1.12–1.23)
CHLORIDE: 102 mmol/L (ref 101–111)
Creatinine, Ser: 0.8 mg/dL (ref 0.44–1.00)
GLUCOSE: 113 mg/dL — AB (ref 65–99)
HCT: 44 % (ref 36.0–46.0)
Hemoglobin: 15 g/dL (ref 12.0–15.0)
Potassium: 4.5 mmol/L (ref 3.5–5.1)
SODIUM: 139 mmol/L (ref 135–145)
TCO2: 27 mmol/L (ref 0–100)

## 2016-03-14 LAB — I-STAT BETA HCG BLOOD, ED (MC, WL, AP ONLY)

## 2016-03-14 SURGERY — IRRIGATION AND DEBRIDEMENT ABSCESS
Anesthesia: General | Site: Rectum

## 2016-03-14 MED ORDER — MIDAZOLAM HCL 2 MG/2ML IJ SOLN
INTRAMUSCULAR | Status: AC
  Filled 2016-03-14: qty 2

## 2016-03-14 MED ORDER — HYDROMORPHONE HCL 1 MG/ML IJ SOLN
1.0000 mg | Freq: Once | INTRAMUSCULAR | Status: AC
  Administered 2016-03-14: 1 mg via INTRAVENOUS
  Filled 2016-03-14: qty 1

## 2016-03-14 MED ORDER — LIDOCAINE-EPINEPHRINE (PF) 1 %-1:200000 IJ SOLN
INTRAMUSCULAR | Status: AC
  Filled 2016-03-14: qty 30

## 2016-03-14 MED ORDER — 0.9 % SODIUM CHLORIDE (POUR BTL) OPTIME
TOPICAL | Status: DC | PRN
  Administered 2016-03-14: 1000 mL

## 2016-03-14 MED ORDER — PROPOFOL 10 MG/ML IV BOLUS
INTRAVENOUS | Status: AC
  Filled 2016-03-14: qty 20

## 2016-03-14 MED ORDER — LIDOCAINE HCL (CARDIAC) 20 MG/ML IV SOLN
INTRAVENOUS | Status: DC | PRN
  Administered 2016-03-14: 60 mg via INTRATRACHEAL

## 2016-03-14 MED ORDER — ACETAMINOPHEN 650 MG RE SUPP
650.0000 mg | RECTAL | Status: DC | PRN
  Filled 2016-03-14: qty 1

## 2016-03-14 MED ORDER — SODIUM CHLORIDE 0.9% FLUSH: 3.0000 mL | INTRAVENOUS | Status: DC | PRN

## 2016-03-14 MED ORDER — HYDROCODONE-ACETAMINOPHEN 5-325 MG PO TABS: 1.0000 | ORAL_TABLET | ORAL | Status: DC | PRN

## 2016-03-14 MED ORDER — HYDROMORPHONE HCL 1 MG/ML IJ SOLN: 0.5000 mg | INTRAMUSCULAR | Status: DC | PRN

## 2016-03-14 MED ORDER — PROPOFOL 500 MG/50ML IV EMUL
INTRAVENOUS | Status: DC | PRN
  Administered 2016-03-14: 200 mg via INTRAVENOUS

## 2016-03-14 MED ORDER — ACETAMINOPHEN 325 MG PO TABS: 650.0000 mg | ORAL_TABLET | ORAL | Status: DC | PRN

## 2016-03-14 MED ORDER — FENTANYL CITRATE (PF) 250 MCG/5ML IJ SOLN
INTRAMUSCULAR | Status: AC
Start: 2016-03-14 — End: 2016-03-14
  Filled 2016-03-14: qty 5

## 2016-03-14 MED ORDER — SULFAMETHOXAZOLE-TRIMETHOPRIM 800-160 MG PO TABS: 1.0000 | ORAL_TABLET | Freq: Two times a day (BID) | ORAL | Status: DC

## 2016-03-14 MED ORDER — FENTANYL CITRATE (PF) 250 MCG/5ML IJ SOLN
INTRAMUSCULAR | Status: DC | PRN
  Administered 2016-03-14: 50 ug via INTRAVENOUS
  Administered 2016-03-14: 100 ug via INTRAVENOUS

## 2016-03-14 MED ORDER — LACTATED RINGERS IV SOLN
INTRAVENOUS | Status: DC | PRN
  Administered 2016-03-14: 10:00:00 via INTRAVENOUS

## 2016-03-14 MED ORDER — LIDOCAINE HCL (CARDIAC) 20 MG/ML IV SOLN
INTRAVENOUS | Status: AC
  Filled 2016-03-14: qty 5

## 2016-03-14 MED ORDER — OXYCODONE HCL 5 MG PO TABS: 5.0000 mg | ORAL_TABLET | ORAL | Status: DC | PRN

## 2016-03-14 MED ORDER — CEFOTETAN DISODIUM-DEXTROSE 2-2.08 GM-% IV SOLR
INTRAVENOUS | Status: AC
  Filled 2016-03-14: qty 50

## 2016-03-14 MED ORDER — DEXTROSE 5 % IV SOLN
2.0000 g | Freq: Once | INTRAVENOUS | Status: AC
  Administered 2016-03-14: 2 g via INTRAVENOUS
  Filled 2016-03-14: qty 2

## 2016-03-14 MED ORDER — ONDANSETRON HCL 4 MG/2ML IJ SOLN: 4.0000 mg | Freq: Once | INTRAMUSCULAR | Status: DC | PRN

## 2016-03-14 MED ORDER — IOHEXOL 300 MG/ML  SOLN
25.0000 mL | Freq: Once | INTRAMUSCULAR | Status: AC | PRN
  Administered 2016-03-14: 25 mL via ORAL

## 2016-03-14 MED ORDER — ONDANSETRON HCL 4 MG/2ML IJ SOLN
INTRAMUSCULAR | Status: DC | PRN
  Administered 2016-03-14: 4 mg via INTRAVENOUS

## 2016-03-14 MED ORDER — LIDOCAINE-EPINEPHRINE (PF) 1 %-1:200000 IJ SOLN
INTRAMUSCULAR | Status: DC | PRN
  Administered 2016-03-14: 10 mL

## 2016-03-14 MED ORDER — IOPAMIDOL (ISOVUE-300) INJECTION 61%
100.0000 mL | Freq: Once | INTRAVENOUS | Status: AC | PRN
  Administered 2016-03-14: 100 mL via INTRAVENOUS

## 2016-03-14 SURGICAL SUPPLY — 22 items
BNDG GAUZE ELAST 4 BULKY (GAUZE/BANDAGES/DRESSINGS) IMPLANT
COVER SURGICAL LIGHT HANDLE (MISCELLANEOUS) ×6 IMPLANT
DRAPE EXTREMITY T 121X128X90 (DRAPE) IMPLANT
DRAPE SHEET LG 3/4 BI-LAMINATE (DRAPES) ×3 IMPLANT
ELECT REM PT RETURN 9FT ADLT (ELECTROSURGICAL) ×3
ELECTRODE REM PT RTRN 9FT ADLT (ELECTROSURGICAL) ×1 IMPLANT
GAUZE SPONGE 4X4 12PLY STRL (GAUZE/BANDAGES/DRESSINGS) ×3 IMPLANT
GLOVE BIOGEL PI IND STRL 7.0 (GLOVE) ×1 IMPLANT
GLOVE BIOGEL PI INDICATOR 7.0 (GLOVE) ×2
GLOVE ECLIPSE 8.0 STRL XLNG CF (GLOVE) ×3 IMPLANT
GLOVE INDICATOR 8.0 STRL GRN (GLOVE) ×6 IMPLANT
GOWN STRL REUS W/TWL LRG LVL3 (GOWN DISPOSABLE) ×3 IMPLANT
GOWN STRL REUS W/TWL XL LVL3 (GOWN DISPOSABLE) ×6 IMPLANT
KIT BASIN OR (CUSTOM PROCEDURE TRAY) ×3 IMPLANT
NS IRRIG 1000ML POUR BTL (IV SOLUTION) ×3 IMPLANT
PACK GENERAL/GYN (CUSTOM PROCEDURE TRAY) ×3 IMPLANT
PAD ABD 8X10 STRL (GAUZE/BANDAGES/DRESSINGS) ×3 IMPLANT
STOCKINETTE 8 INCH (MISCELLANEOUS) IMPLANT
SWAB COLLECTION DEVICE MRSA (MISCELLANEOUS) IMPLANT
SWAB CULTURE ESWAB REG 1ML (MISCELLANEOUS) IMPLANT
TOWEL OR 17X26 10 PK STRL BLUE (TOWEL DISPOSABLE) ×3 IMPLANT
UNDERPAD 30X30 INCONTINENT (UNDERPADS AND DIAPERS) IMPLANT

## 2016-03-14 NOTE — ED Provider Notes (Signed)
6:40 AM Handoff from Stamps PA-C at shift change. Patient with perirectal abscess demonstrated on exam and CT. Surgery has been contacted and will see patient in ED.   Currently awaiting surgery consult and reccs.   BP 104/70 mmHg  Pulse 76  Temp(Src) 98.1 F (36.7 C) (Oral)  Resp 20  Ht 5\' 9"  (1.753 m)  Wt 86.637 kg  BMI 28.19 kg/m2  SpO2 99%  LMP 03/06/2016 (Approximate)  8:32 AM Surgery has seen patient and plan it to go to OR for I&D.   BP 112/79 mmHg  Pulse 80  Temp(Src) 98.6 F (37 C) (Oral)  Resp 18  Ht 5\' 9"  (1.753 m)  Wt 86.637 kg  BMI 28.19 kg/m2  SpO2 96%  LMP 03/06/2016 (Approximate)   Carlisle Cater, PA-C 03/14/16 [id number]  April Palumbo, MD 03/14/16 2318

## 2016-03-14 NOTE — Anesthesia Procedure Notes (Signed)
Procedure Name: LMA Insertion Date/Time: 03/14/2016 10:46 AM Performed by: Cynda Familia Pre-anesthesia Checklist: Patient identified, Emergency Drugs available, Suction available and Patient being monitored Patient Re-evaluated:Patient Re-evaluated prior to inductionOxygen Delivery Method: Circle System Utilized Preoxygenation: Pre-oxygenation with 100% oxygen Intubation Type: IV induction Ventilation: Mask ventilation without difficulty LMA: LMA inserted LMA Size: 4.0 Tube type: Oral Number of attempts: 1 Airway Equipment and Method: Bite block Placement Confirmation: positive ETCO2 Tube secured with: Tape Dental Injury: Teeth and Oropharynx as per pre-operative assessment  Comments: IV induction and LMA insertion AM CRNA- atraumatic - teeth and mouth as preop- Tamala Julian present

## 2016-03-14 NOTE — ED Provider Notes (Signed)
Care assumed from Florene Glen, NP.  Renee Schroeder is a 40 y.o. female perirectal pain x 4 days.  No BM in 3 days due to pain.  On exam by initial provider, fluctuant area externally that proceeds deep into the anus and is palpable in DRE.  Hx of hydradenitis with recurrent abscesses, but no hx of perirectal abscess.     Physical Exam  BP 113/77 mmHg  Pulse 87  Temp(Src) 99.9 F (37.7 C) (Oral)  Resp 20  Ht 5\' 9"  (1.753 m)  Wt 86.637 kg  BMI 28.19 kg/m2  SpO2 98%  LMP 03/06/2016 (Approximate)  Physical Exam   Face to face Exam:   General: Awake  HEENT: Atraumatic  Resp: Normal effort  Abd: Nondistended  Neuro:No focal weakness  Lymph: No adenopathy GU: palpable area of fluctuance in the right perirectal area  Results for orders placed or performed during the hospital encounter of 03/13/16  CBC with Differential  Result Value Ref Range   WBC 9.6 4.0 - 10.5 K/uL   RBC 4.46 3.87 - 5.11 MIL/uL   Hemoglobin 13.1 12.0 - 15.0 g/dL   HCT 39.7 36.0 - 46.0 %   MCV 89.0 78.0 - 100.0 fL   MCH 29.4 26.0 - 34.0 pg   MCHC 33.0 30.0 - 36.0 g/dL   RDW 14.6 11.5 - 15.5 %   Platelets 274 150 - 400 K/uL   Neutrophils Relative % 75 %   Neutro Abs 7.2 1.7 - 7.7 K/uL   Lymphocytes Relative 16 %   Lymphs Abs 1.5 0.7 - 4.0 K/uL   Monocytes Relative 8 %   Monocytes Absolute 0.8 0.1 - 1.0 K/uL   Eosinophils Relative 1 %   Eosinophils Absolute 0.1 0.0 - 0.7 K/uL   Basophils Relative 0 %   Basophils Absolute 0.0 0.0 - 0.1 K/uL  I-stat chem 8, ed  Result Value Ref Range   Sodium 139 135 - 145 mmol/L   Potassium 4.5 3.5 - 5.1 mmol/L   Chloride 102 101 - 111 mmol/L   BUN 11 6 - 20 mg/dL   Creatinine, Ser 0.80 0.44 - 1.00 mg/dL   Glucose, Bld 113 (H) 65 - 99 mg/dL   Calcium, Ion 1.15 1.12 - 1.23 mmol/L   TCO2 27 0 - 100 mmol/L   Hemoglobin 15.0 12.0 - 15.0 g/dL   HCT 44.0 36.0 - 46.0 %  I-Stat Beta hCG blood, ED ([facility], [facility], [facility] only)  Result Value Ref Range   I-stat hCG, quantitative  <5.0 <5 mIU/mL   Comment 3           Ct Abdomen Pelvis W Contrast  03/14/2016  CLINICAL DATA:  Perirectal abscess. Patient reports onset yesterday, throbbing rectal pain. EXAM: CT ABDOMEN AND PELVIS WITH CONTRAST TECHNIQUE: Multidetector CT imaging of the abdomen and pelvis was performed using the standard protocol following bolus administration of intravenous contrast. CONTRAST:  144mL ISOVUE-300 IOPAMIDOL (ISOVUE-300) INJECTION 61%, 67mL OMNIPAQUE IOHEXOL 300 MG/ML SOLN COMPARISON:  None. FINDINGS: Lower chest:  Dependent subsegmental atelectasis at the bases. Liver: No focal lesion. Hepatobiliary: Gallbladder physiologically distended, no calcified stone. No biliary dilatation. Pancreas: No ductal dilatation or inflammation. Spleen: Normal. Adrenal glands: No nodule. Kidneys: Symmetric renal enhancement and excretion. No hydronephrosis. Stomach/Bowel: Stomach physiologically distended. There are no dilated or thickened small bowel loops. Moderate stool burden in the right colon. Small to moderate stool throughout the remainder the colon. The appendix is normal. Right lateral perirectal fluid collection measures 3.6 x 2.0  x 4.0 cm with internal septations and adjacent soft tissue stranding. Mild edema about the distal pericolonic fat of the sigmoid colon. There is a 7 mm perisigmoid lymph node. Vascular/Lymphatic: Prominent bilateral inguinal lymph nodes. Enlarged right external iliac node measures 12 mm short axis. Enlarged right internal iliac lymph nodes. Abdominal aorta is normal in caliber. Reproductive: Uterus is retroverted with probable anterior fibroid. Ovaries symmetric in size. Bladder: Physiologically distended without wall thickening. Other: No free air, free fluid, or intra-abdominal fluid collection. Musculoskeletal: There are no acute or suspicious osseous abnormalities. IMPRESSION: 1. Right perirectal abscess measuring 3.6 x 2.0 x 4.0 cm. 2. Right internal and external iliac adenopathy is  likely reactive. 3. Uterine fibroids. Electronically Signed   By: Jeb Levering M.D.   On: 03/14/2016 01:24      ED Course  Procedures  1. Perirectal abscess      MDM  Plan: CT scan.  Surgery consult and admission.    3:19 AM Discussed with Dr. Zella Richer who will evaluate in the ED and plan for I&D here in the ED.  Does not suggest IV abx at this time.  Will keep pt comfortable and NPO.    6:41 AM At shift change care transferred to [facility], PA-C who is awaiting surgical consult in the ED.  He will reassess and determine disposition after surgical consult is complete.    Abigail Butts, PA-C 03/14/16 [id number]  April Palumbo, MD 03/14/16 [phone]

## 2016-03-14 NOTE — H&P (Signed)
Renee Schroeder is an 40 y.o. female.   Chief Complaint:  Anorectal pain and swelling. HPI:  She had the onset of right-sided perianal pain and swelling 4 days ago. She tried to get the area to come to a head and drained but was unsuccessful. The pain is increased. No fever or chills. She was seen in the emergency department and diagnosed with a right-sided perirectal abscess. I was subsequently asked to see her for this.  She has a history of hidradenitis and has had abscesses on other areas of her body. She states this is her first perirectal abscess. She works as a [employer] manager.  She states she is not had a bowel movement in the past 3 days due to the pain.  Past Medical History  Diagnosis Date  . No pertinent past medical history   . Sciatica   . Hydradenitis     Past Surgical History  Procedure Laterality Date  . Dilation and curettage of uterus    . Laparoscopy  05/26/2012    Procedure: LAPAROSCOPY OPERATIVE;  Surgeon: Mora Bellman, MD;  Location: [facility];  Service: Gynecology;  Laterality: N/A;  Operative Laparoscopy with Right Salpingectomy  . Sweat glands removed from rt. under arm Right     Family History  Problem Relation Age of Onset  . Other Neg Hx    Social History:  reports that she has been smoking Cigarettes.  She has been smoking about 0.50 packs per day. She does not have any smokeless tobacco history on file. She reports that she does not drink alcohol or use illicit drugs.  Allergies: No Known Allergies  Prior to Admission medications   Medication Sig Start Date End Date Taking? Authorizing Provider  ibuprofen (ADVIL,MOTRIN) 200 MG tablet Take 400 mg by mouth every 6 (six) hours as needed for moderate pain.   Yes Historical Provider, MD  Pediatric Multiple Vit-C-FA (PEDIATRIC MULTIVITAMIN) chewable tablet Chew 2 tablets by mouth daily.   Yes Historical Provider, MD  cyclobenzaprine (FLEXERIL) 10 MG tablet Take 1 tablet (10 mg total) by mouth 3 (three) times daily as needed  for muscle spasms. Patient not taking: Reported on 03/11/2015 01/30/14   Dalia Heading, PA-C  HYDROcodone-acetaminophen (NORCO/VICODIN) 5-325 MG per tablet Take 1 tablet by mouth every 6 (six) hours as needed for moderate pain. Patient not taking: Reported on 03/11/2015 01/30/14   Dalia Heading, PA-C  hydrOXYzine (ATARAX/VISTARIL) 10 MG tablet Take 1 tablet (10 mg total) by mouth 3 (three) times daily as needed for itching. Patient not taking: Reported on 03/11/2015 02/20/14   Lutricia Feil, PA  medroxyPROGESTERone (PROVERA) 10 MG tablet Take 1 tablet (10 mg total) by mouth daily. Use for ten days Patient not taking: Reported on 03/14/2016 03/11/15   Seabron Spates, CNM  metroNIDAZOLE (FLAGYL) 500 MG tablet Take 1 tablet (500 mg total) by mouth 2 (two) times daily. Patient not taking: Reported on 03/14/2016 03/11/15   Seabron Spates, CNM  norgestimate-ethinyl estradiol (ORTHO-CYCLEN,SPRINTEC,PREVIFEM) 0.25-35 MG-MCG tablet Take 1 tablet by mouth daily. Patient not taking: Reported on 03/11/2015 06/15/12 06/15/13  Mora Bellman, MD  permethrin (ELIMITE) 5 % cream Apply to affected area once, leave 8 hours then shower. Repeat same treatment one week later Patient not taking: Reported on 03/11/2015 02/20/14   Audelia Hives Presson, PA  predniSONE (DELTASONE) 50 MG tablet Take 1 tablet (50 mg total) by mouth daily. Patient not taking: Reported on 03/11/2015 01/30/14   Dalia Heading, PA-C  sulfamethoxazole-trimethoprim (BACTRIM  DS,SEPTRA DS) 800-160 MG per tablet Take 1 tablet by mouth 2 (two) times daily. Patient not taking: Reported on 03/14/2016 03/11/15   Seabron Spates, CNM     Results for orders placed or performed during the hospital encounter of 03/13/16 (from the past 48 hour(s))  CBC with Differential     Status: None   Collection Time: 03/13/16 11:51 PM  Result Value Ref Range   WBC 9.6 4.0 - 10.5 K/uL   RBC 4.46 3.87 - 5.11 MIL/uL   Hemoglobin 13.1 12.0 - 15.0 g/dL   HCT 39.7 36.0  - 46.0 %   MCV 89.0 78.0 - 100.0 fL   MCH 29.4 26.0 - 34.0 pg   MCHC 33.0 30.0 - 36.0 g/dL   RDW 14.6 11.5 - 15.5 %   Platelets 274 150 - 400 K/uL   Neutrophils Relative % 75 %   Neutro Abs 7.2 1.7 - 7.7 K/uL   Lymphocytes Relative 16 %   Lymphs Abs 1.5 0.7 - 4.0 K/uL   Monocytes Relative 8 %   Monocytes Absolute 0.8 0.1 - 1.0 K/uL   Eosinophils Relative 1 %   Eosinophils Absolute 0.1 0.0 - 0.7 K/uL   Basophils Relative 0 %   Basophils Absolute 0.0 0.0 - 0.1 K/uL  I-Stat Beta hCG blood, ED ([facility], [facility], [facility] only)     Status: None   Collection Time: 03/14/16 12:00 AM  Result Value Ref Range   I-stat hCG, quantitative <5.0 <5 mIU/mL   Comment 3            Comment:   GEST. AGE      CONC.  (mIU/mL)   <=1 WEEK        5 - 50     2 WEEKS       50 - 500     3 WEEKS       100 - 10,000     4 WEEKS     1,000 - 30,000        FEMALE AND NON-PREGNANT FEMALE:     LESS THAN 5 mIU/mL   I-stat chem 8, ed     Status: Abnormal   Collection Time: 03/14/16 12:02 AM  Result Value Ref Range   Sodium 139 135 - 145 mmol/L   Potassium 4.5 3.5 - 5.1 mmol/L   Chloride 102 101 - 111 mmol/L   BUN 11 6 - 20 mg/dL   Creatinine, Ser 0.80 0.44 - 1.00 mg/dL   Glucose, Bld 113 (H) 65 - 99 mg/dL   Calcium, Ion 1.15 1.12 - 1.23 mmol/L   TCO2 27 0 - 100 mmol/L   Hemoglobin 15.0 12.0 - 15.0 g/dL   HCT 44.0 36.0 - 46.0 %   Ct Abdomen Pelvis W Contrast  03/14/2016  CLINICAL DATA:  Perirectal abscess. Patient reports onset yesterday, throbbing rectal pain. EXAM: CT ABDOMEN AND PELVIS WITH CONTRAST TECHNIQUE: Multidetector CT imaging of the abdomen and pelvis was performed using the standard protocol following bolus administration of intravenous contrast. CONTRAST:  178mL ISOVUE-300 IOPAMIDOL (ISOVUE-300) INJECTION 61%, 59mL OMNIPAQUE IOHEXOL 300 MG/ML SOLN COMPARISON:  None. FINDINGS: Lower chest:  Dependent subsegmental atelectasis at the bases. Liver: No focal lesion. Hepatobiliary: Gallbladder physiologically distended,  no calcified stone. No biliary dilatation. Pancreas: No ductal dilatation or inflammation. Spleen: Normal. Adrenal glands: No nodule. Kidneys: Symmetric renal enhancement and excretion. No hydronephrosis. Stomach/Bowel: Stomach physiologically distended. There are no dilated or thickened small bowel loops. Moderate stool burden in the right colon.  Small to moderate stool throughout the remainder the colon. The appendix is normal. Right lateral perirectal fluid collection measures 3.6 x 2.0 x 4.0 cm with internal septations and adjacent soft tissue stranding. Mild edema about the distal pericolonic fat of the sigmoid colon. There is a 7 mm perisigmoid lymph node. Vascular/Lymphatic: Prominent bilateral inguinal lymph nodes. Enlarged right external iliac node measures 12 mm short axis. Enlarged right internal iliac lymph nodes. Abdominal aorta is normal in caliber. Reproductive: Uterus is retroverted with probable anterior fibroid. Ovaries symmetric in size. Bladder: Physiologically distended without wall thickening. Other: No free air, free fluid, or intra-abdominal fluid collection. Musculoskeletal: There are no acute or suspicious osseous abnormalities. IMPRESSION: 1. Right perirectal abscess measuring 3.6 x 2.0 x 4.0 cm. 2. Right internal and external iliac adenopathy is likely reactive. 3. Uterine fibroids. Electronically Signed   By: Jeb Levering M.D.   On: 03/14/2016 01:24    Review of Systems  Constitutional: Negative for fever and chills.  Cardiovascular: Negative for chest pain.  Gastrointestinal: Positive for constipation.  Endo/Heme/Allergies: Does not bruise/bleed easily.    Blood pressure 99/65, pulse 71, temperature 98.6 F (37 C), temperature source Oral, resp. rate 18, height 5\' 9"  (1.753 m), weight 86.637 kg (191 lb), last menstrual period 03/06/2016, SpO2 95 %. Physical Exam  Constitutional:  Overweight female who appears uncomfortable.  HENT:  Head: Normocephalic and atraumatic.   Cardiovascular: Normal rate and regular rhythm.   Respiratory: Effort normal and breath sounds normal.  Genitourinary:  Right perianal swelling, tenderness, and fluctuance. Measures approximately 2.5-3 cm.     Assessment/Plan Right perirectal abscess.  Plan: To the operating room for drainage of right perirectal abscess with local anesthesia and sedation. We went over the procedure and the risks. Risks including but limited to bleeding, recurrent infection, wound healing problems, anesthesia. We talked about having a Penrose drain in after surgery and discussed wound care. She seems to understand everything and agrees with the plan.  Odis Hollingshead, MD 03/14/2016, 9:52 AM

## 2016-03-14 NOTE — Transfer of Care (Signed)
Immediate Anesthesia Transfer of Care Note  Patient: Renee Schroeder  Procedure(s) Performed: Procedure(s): IRRIGATION AND DEBRIDEMENT ABSCESS (N/A)  Patient Location: PACU  Anesthesia Type:General  Level of Consciousness: awake and alert   Airway & Oxygen Therapy: Patient Spontanous Breathing and Patient connected to face mask oxygen  Post-op Assessment: Report given to RN and Post -op Vital signs reviewed and stable  Post vital signs: Reviewed and stable  Last Vitals:  Filed Vitals:   03/14/16 0930 03/14/16 1000  BP: 104/69 101/69  Pulse: 68 69  Temp:    Resp:      Complications: No apparent anesthesia complications

## 2016-03-14 NOTE — ED Notes (Signed)
Spoke with andrea in OR, OR will administer Cefotetan.

## 2016-03-14 NOTE — Op Note (Signed)
Operative Note  Renee Schroeder female 40 y.o. 03/14/2016  PREOPERATIVE DX:  Anorectal abscess  POSTOPERATIVE DX:  Same  PROCEDURE:   Incision and drainage of anorectal abscess         Surgeon: Odis Hollingshead   Assistants: none  Anesthesia: General LMA anesthesia  Indications:   This is a 40 year old female with a four-day history of increasing perianal pain and swelling. She came to the emergency department and has been diagnosed with anal rectal abscess. She now presents for incision and drainage.    Procedure Detail:  She is brought to the operating room, placed supine on the operating table. She chose a general anesthetic instead of sedation. Once the general anesthetic had been given, she was turned in the left lateral decubitus position. The perianal area was sterilely prepped and draped. A timeout was performed.  The abscess was identified in the right perianal area. A cruciate incision was made in it and purulent fluid drained. The corner was cut off the cruciate incision to create a defect. I digitally examined the abscess cavity. It extended approximately 3 cm. A digital rectal exam did not demonstrate any communication there was obvious by rectal exam with the abscess cavity. Bleeding was controlled with electrocautery. A block was performed with Xylocaine with epinephrine.  Once hemostasis was adequate, a quarter-inch Penrose drain was placed down into the abscess cavity and anchored to the skin with 3-0 chromic suture. A bulky dressing was applied.  She tolerated the procedure without any apparent complications. She was taken to the recovery room in satisfactory condition. She'll be discharged to home with discharge instructions, pain medication, and antibiotics. She will follow up in the office in 2-3 weeks.

## 2016-03-14 NOTE — Anesthesia Preprocedure Evaluation (Addendum)
Anesthesia Evaluation  Patient identified by MRN, date of birth, ID band Patient awake    Reviewed: Allergy & Precautions, NPO status , Patient's Chart, lab work & pertinent test results  Airway Mallampati: II       Dental  (+) Dental Advisory Given   Pulmonary Current Smoker,    Pulmonary exam normal        Cardiovascular Normal cardiovascular exam     Neuro/Psych  Neuromuscular disease    GI/Hepatic   Endo/Other    Renal/GU      Musculoskeletal   Abdominal   Peds  Hematology   Anesthesia Other Findings   Reproductive/Obstetrics                           Anesthesia Physical Anesthesia Plan  ASA: II  Anesthesia Plan: General   Post-op Pain Management:    Induction: Intravenous  Airway Management Planned: Oral ETT and LMA  Additional Equipment:   Intra-op Plan:   Post-operative Plan: Extubation in OR  Informed Consent: I have reviewed the patients History and Physical, chart, labs and discussed the procedure including the risks, benefits and alternatives for the proposed anesthesia with the patient or authorized representative who has indicated his/her understanding and acceptance.     Plan Discussed with: CRNA, Anesthesiologist and Surgeon  Anesthesia Plan Comments:         Anesthesia Quick Evaluation

## 2016-03-14 NOTE — Anesthesia Postprocedure Evaluation (Signed)
Anesthesia Post Note  Patient: Renee Schroeder  Procedure(s) Performed: Procedure(s) (LRB): IRRIGATION AND DEBRIDEMENT ABSCESS (N/A)  Patient location during evaluation: PACU Anesthesia Type: General Level of consciousness: sedated, awake and oriented Pain management: pain level controlled Vital Signs Assessment: post-procedure vital signs reviewed and stable Respiratory status: spontaneous breathing and respiratory function stable Cardiovascular status: blood pressure returned to baseline and stable Anesthetic complications: no    Last Vitals:  Filed Vitals:   03/14/16 1000 03/14/16 1115  BP: 101/69   Pulse: 69 80  Temp:  37 C  Resp:      Last Pain:  Filed Vitals:   03/14/16 1121  PainSc: 10-Worst pain ever                 Brandon Wiechman EDWARD

## 2016-03-14 NOTE — ED Notes (Signed)
Surgery at bedside.

## 2016-03-14 NOTE — ED Notes (Signed)
Consent obtained. Pt transported to OR.

## 2016-03-14 NOTE — Discharge Instructions (Signed)
[facility] _______[facility]  RECTAL SURGERY POST OP INSTRUCTIONS: POST OP INSTRUCTIONS  Always review your discharge instruction sheet given to you by the facility where your surgery was performed. IF YOU HAVE DISABILITY OR FAMILY LEAVE FORMS, YOU MUST BRING THEM TO THE OFFICE FOR PROCESSING.   DO NOT GIVE THEM TO YOUR DOCTOR.  1. A  prescription for pain medication may be given to you upon discharge.  Take your pain medication as prescribed, if needed.  If narcotic pain medicine is not needed, then you may take acetaminophen (Tylenol) or ibuprofen (Advil) as needed. 2. Take your usually prescribed medications unless otherwise directed. 3. If you need a refill on your pain medication, please contact your pharmacy.  They will contact our office to request authorization. Prescriptions will not be filled after 5 pm or on week-ends. 4. You should follow a light diet the first 48 hours after arrival home, such as soup and crackers, etc.  Be sure to include lots of fluids daily.  Resume your normal diet 2-3 days after surgery.. 5. Most patients will experience some swelling and discomfort in the rectal area. Shower daily.  When the rubber drain tube falls out, start warm water tube soaks twice a day. It is common to experience some constipation if taking pain medication after surgery.  Increasing fluid intake and taking a stool softener (such as Colace) will usually help or prevent this problem from occurring.  A mild laxative (Milk of Magnesia or Miralax) should be taken according to package directions if there are no bowel movements after 48 hours. 6. Unless discharge instructions indicate otherwise, leave your bandage dry and in place for 24 hours, or remove the bandage if you have a bowel movement. You may notice a small amount of bleeding with bowel movements for the first few days. You may have some packing in the rectum which will come out over the first day or two. You will need to wear an  heavy absorbent pad or soft cotton gauze in your underwear until the drainage stops. 7. ACTIVITIES:  You may resume regular (light) daily activities beginning the next day--such as daily self-care, walking, climbing stairs--gradually increasing activities as tolerated.  You may have sexual intercourse when it is comfortable.  Refrain from any heavy lifting or straining for 2- 3 days.  a. You may drive when you are no longer taking prescription pain medication, you can comfortably wear a seatbelt, and you can safely maneuver your car and apply brakes. b. RETURN TO WORK: : _2-3 days when comfortable.___________________ c.  8. You should see your doctor in the office for a follow-up appointment approximately 2-3 weeks after your surgery.  Make sure that you call for this appointment within a day or two after you arrive home to insure a convenient appointment time. 9. OTHER INSTRUCTIONS:  ____Take Milk of Magnesia twice a day until you have a bowel movement.______________________________________________________________________________________________________________________________________________________________________________________  WHEN TO CALL YOUR DOCTOR: 1. Fever over 101.0 2. Inability to urinate 3. Nausea and/or vomiting 4. Extreme swelling or bruising 5. Continued bleeding from rectum. 6. Increased pain, redness, or drainage from the incision 7. Constipation  The clinic staff is available to answer your questions during regular business hours.  Please dont hesitate to call and ask to speak to one of the nurses for clinical concerns.  If you have a medical emergency, go to the nearest emergency room or call 911.  A surgeon from [facility] is always on call at the  hospital   [address], [place], [place], [place]  [address] ?  [address], [place], [place]   [address] [phone] ? [phone] ? FAX [phone] Web site: [email or web address]

## 2016-03-15 ENCOUNTER — Encounter (HOSPITAL_COMMUNITY): Payer: Self-pay | Admitting: General Surgery

## 2016-03-15 ENCOUNTER — Telehealth: Payer: Self-pay | Admitting: *Deleted

## 2016-08-18 ENCOUNTER — Encounter (HOSPITAL_COMMUNITY): Payer: Self-pay | Admitting: Emergency Medicine

## 2016-08-18 ENCOUNTER — Emergency Department (HOSPITAL_COMMUNITY)
Admission: EM | Admit: 2016-08-18 | Discharge: 2016-08-19 | Disposition: A | Discharge status: Home / Self Care | Payer: Self-pay | Attending: Emergency Medicine | Admitting: Emergency Medicine

## 2016-08-18 DIAGNOSIS — F17210 Nicotine dependence, cigarettes, uncomplicated: Secondary | ICD-10-CM | POA: Insufficient documentation

## 2016-08-18 DIAGNOSIS — J029 Acute pharyngitis, unspecified: Secondary | ICD-10-CM | POA: Insufficient documentation

## 2016-08-18 LAB — RAPID STREP SCREEN (MED CTR MEBANE ONLY): Streptococcus, Group A Screen (Direct): NEGATIVE

## 2016-08-18 NOTE — ED Provider Notes (Signed)
[facility] DEPT Provider Note   CSN: [id number] Arrival date & time: 08/18/16  2140   By signing my name below, I, Royce Macadamia, attest that this documentation has been prepared under the direction and in the presence of  Debroah Baller, NP. Electronically Signed: Royce Macadamia, ED Scribe. 08/18/16. 11:58 PM.   History   Chief Complaint Chief Complaint  Patient presents with  . Sore Throat   The history is provided by the patient and medical records. No language interpreter was used.  Sore Throat  The current episode started more than 2 days ago. Pertinent negatives include no abdominal pain.     HPI Comments:  Renee Schroeder is a 40 y.o. female who presents to the Emergency Department complaining of a gradually worsening sore throat beginning yesterday.  She notes associated ear pain and some chills today.  She gargled with salt water with minimal relief.  She denies nausea, vomiting and abdominal pain.  She reports no additional injury or complaint.    Past Medical History:  Diagnosis Date  . Hydradenitis   . No pertinent past medical history   . Sciatica     There are no active problems to display for this patient.   Past Surgical History:  Procedure Laterality Date  . DILATION AND CURETTAGE OF UTERUS    . IRRIGATION AND DEBRIDEMENT ABSCESS N/A 03/14/2016   Procedure: IRRIGATION AND DEBRIDEMENT ABSCESS;  Surgeon: Jackolyn Confer, MD;  Location: [facility];  Service: General;  Laterality: N/A;  . LAPAROSCOPY  05/26/2012   Procedure: LAPAROSCOPY OPERATIVE;  Surgeon: Mora Bellman, MD;  Location: [facility];  Service: Gynecology;  Laterality: N/A;  Operative Laparoscopy with Right Salpingectomy  . Sweat glands removed from rt. under arm Right     OB History    Gravida Para Term Preterm AB Living   5 2 2  0 2 2   SAB TAB Ectopic Multiple Live Births   0 1 1 0         Home Medications    Prior to Admission medications   Medication Sig Start Date End Date  Taking? Authorizing Provider  benzocaine-menthol (CHLORASEPTIC) 6-10 MG lozenge Take 1 lozenge by mouth as needed for sore throat. 08/19/16   Germain Koopmann Bunnie Pion, NP  HYDROcodone-acetaminophen (NORCO/VICODIN) 5-325 MG tablet Take 1-2 tablets by mouth every 4 (four) hours as needed for moderate pain or severe pain. 03/14/16   Jackolyn Confer, MD  ibuprofen (ADVIL,MOTRIN) 200 MG tablet Take 400 mg by mouth every 6 (six) hours as needed for moderate pain.    Historical Provider, MD  naproxen (NAPROSYN) 500 MG tablet Take 1 tablet (500 mg total) by mouth 2 (two) times daily. 08/19/16   [place], NP  norgestimate-ethinyl estradiol (ORTHO-CYCLEN,SPRINTEC,PREVIFEM) 0.25-35 MG-MCG tablet Take 1 tablet by mouth daily. Patient not taking: Reported on 03/11/2015 06/15/12 06/15/13  Mora Bellman, MD  Pediatric Multiple Vit-C-FA (PEDIATRIC MULTIVITAMIN) chewable tablet Chew 2 tablets by mouth daily.    Historical Provider, MD  sulfamethoxazole-trimethoprim (BACTRIM DS,SEPTRA DS) 800-160 MG tablet Take 1 tablet by mouth 2 (two) times daily. 03/14/16   Jackolyn Confer, MD    Family History Family History  Problem Relation Age of Onset  . Other Neg Hx     Social History Social History  Substance Use Topics  . Smoking status: Current Some Day Smoker    Packs/day: 0.50    Types: Cigarettes  . Smokeless tobacco: Not on file  . Alcohol use No     Allergies  Review of patient's allergies indicates no known allergies.   Review of Systems Review of Systems  Constitutional: Positive for chills.  HENT: Positive for ear pain and sore throat.   Gastrointestinal: Negative for abdominal pain, nausea and vomiting.     Physical Exam Updated Vital Signs BP 109/70 (BP Location: Right Arm)   Pulse 69   Temp 98.6 F (37 C) (Oral)   Resp 16   Ht 5\' 9"  (1.753 m)   Wt 94.8 kg   SpO2 100%   BMI 30.86 kg/m   Physical Exam  Constitutional: She is oriented to person, place, and time. She appears well-developed and  well-nourished. No distress.  HENT:  Head: Normocephalic and atraumatic.  Right Ear: Tympanic membrane normal.  Left Ear: Tympanic membrane normal.  Mouth/Throat: Uvula is midline. Posterior oropharyngeal erythema present. No oropharyngeal exudate, posterior oropharyngeal edema or tonsillar abscesses.  Uvula midline.  No edema, mild erythema.  No tonsillar abscess.    Eyes: Conjunctivae and EOM are normal.  Neck: Normal range of motion. Neck supple.  Cardiovascular: Normal rate and regular rhythm.  Exam reveals no friction rub.   No murmur heard. Pulmonary/Chest: Effort normal and breath sounds normal. No respiratory distress. She has no wheezes. She has no rales.  Abdominal: Soft. There is no tenderness.  Lymphadenopathy:    She has cervical adenopathy.  Neurological: She is alert and oriented to person, place, and time.  Skin: Skin is warm and dry.  Psychiatric: She has a normal mood and affect. Her behavior is normal.  Nursing note and vitals reviewed.   ED Treatments / Results   DIAGNOSTIC STUDIES:  Oxygen Saturation is 99% on RA, NML by my interpretation.    COORDINATION OF CARE:  11:59 PM Discussed treatment plan with pt at bedside and pt agreed to plan.  Labs (all labs ordered are listed, but only abnormal results are displayed) Labs Reviewed  RAPID STREP SCREEN (NOT AT [facility])  CULTURE, GROUP A STREP [facility]   Radiology No results found.  Procedures Procedures (including critical care time)  Medications Ordered in ED Medications  ibuprofen (ADVIL,MOTRIN) tablet 600 mg (600 mg Oral Given 08/19/16 0038)  dexamethasone (DECADRON) tablet 10 mg (10 mg Oral Given 08/19/16 0039)     Initial Impression / Assessment and Plan / ED Course  I have reviewed the triage vital signs and the nursing notes.  Pertinent lab results that were available during my care of the patient were reviewed by me and considered in my medical decision making (see chart for details).  Clinical  Course    Final Clinical Impressions(s) / ED Diagnoses  40 y.o. female with sore throat stable for d/c without difficulty swallowing, no fever and does not appear toxic.negative strep screen. Will treat as viral sore throat.Discussed with the patient and all questioned fully answered. She will retrn if any problems arise.  Final diagnoses:  Sore throat    New Prescriptions Discharge Medication List as of 08/19/2016 12:16 AM    START taking these medications   Details  benzocaine-menthol (CHLORASEPTIC) 6-10 MG lozenge Take 1 lozenge by mouth as needed for sore throat., Starting Thu 08/19/2016, Print    naproxen (NAPROSYN) 500 MG tablet Take 1 tablet (500 mg total) by mouth 2 (two) times daily., Starting Thu 08/19/2016, Print       I personally performed the services described in this documentation, which was scribed in my presence. The recorded information has been reviewed and is accurate.    [place]  Janit Bern, NP 08/19/16 [id number]    Virgel Manifold, MD 08/19/16 [phone]

## 2016-08-18 NOTE — ED Triage Notes (Signed)
Pt. reports sore throat with swelling onset this week worse yesterday , denies fever or chills , resuirations unlabored.

## 2016-08-19 MED ORDER — DEXAMETHASONE 4 MG PO TABS
10.0000 mg | ORAL_TABLET | Freq: Once | ORAL | Status: AC
  Administered 2016-08-19: 10 mg via ORAL
  Filled 2016-08-19: qty 3

## 2016-08-19 MED ORDER — NAPROXEN 500 MG PO TABS: 500.0000 mg | ORAL_TABLET | Freq: Two times a day (BID) | ORAL | 0 refills | Status: DC

## 2016-08-19 MED ORDER — IBUPROFEN 400 MG PO TABS
600.0000 mg | ORAL_TABLET | Freq: Once | ORAL | Status: AC
  Administered 2016-08-19: 600 mg via ORAL
  Filled 2016-08-19: qty 1

## 2016-08-19 MED ORDER — BENZOCAINE-MENTHOL 6-10 MG MT LOZG: 1.0000 | LOZENGE | OROMUCOSAL | 0 refills | Status: DC | PRN

## 2016-08-19 NOTE — Discharge Instructions (Signed)
Your strep screen tonight is negative. Follow up with your doctor or return here as needed for worsening symptoms.

## 2016-08-19 NOTE — ED Notes (Signed)
Pt departed in NAD.  

## 2016-08-21 LAB — CULTURE, GROUP A STREP (THRC)

## 2016-12-06 DIAGNOSIS — O24419 Gestational diabetes mellitus in pregnancy, unspecified control: Secondary | ICD-10-CM

## 2016-12-06 HISTORY — DX: Gestational diabetes mellitus in pregnancy, unspecified control: O24.419

## 2016-12-06 NOTE — L&D Delivery Note (Signed)
Patient is 41 y.o. [id number] [redacted]w[redacted]d admitted SOL. Prenatal course also complicated by [facility] and limited prenatal care.  Delivery Note At 8:06 AM a viable female was delivered via Vaginal, Spontaneous Delivery (Presentation: LOA).  APGAR: 8, 9; weight pending.   Placenta status: spontaneous, intact.  Cord: 3 vessel   Anesthesia:  None Episiotomy: None Lacerations: None Suture Repair: N/A Est. Blood Loss (mL): 50  Mom to postpartum.  Baby to Couplet care / Skin to Skin.   Upon arrival patient was complete and pushing. She pushed with good maternal effort to deliver a viable female infant in cephalic, LOA position. No nuchal cord present.  Baby delivered without difficulty (anterior shoulder delivered with ease), was noted to have good tone and placed on maternal abdomen for oral suctioning, drying and stimulation. Delayed cord clamping performed. Placenta delivered spontaneously with gentle cord traction. Fundus firm with massage and Pitocin. Perineum inspected and found to have no lacerations. Counts of sharps, instruments, and lap pads were all correct.   Metta Clines, DO PGY-2 Family Medicine Resident 08/21/2017, 8:28 AM

## 2017-01-23 ENCOUNTER — Encounter (HOSPITAL_COMMUNITY): Payer: Self-pay

## 2017-01-23 ENCOUNTER — Inpatient Hospital Stay (HOSPITAL_COMMUNITY)
Admission: AD | Admit: 2017-01-23 | Discharge: 2017-01-24 | Disposition: A | Discharge status: Home / Self Care | Payer: Medicaid Other | Source: Ambulatory Visit | Attending: Obstetrics & Gynecology | Admitting: Obstetrics & Gynecology

## 2017-01-23 ENCOUNTER — Inpatient Hospital Stay (HOSPITAL_COMMUNITY): Payer: Medicaid Other

## 2017-01-23 DIAGNOSIS — A599 Trichomoniasis, unspecified: Secondary | ICD-10-CM | POA: Insufficient documentation

## 2017-01-23 DIAGNOSIS — O98311 Other infections with a predominantly sexual mode of transmission complicating pregnancy, first trimester: Secondary | ICD-10-CM | POA: Insufficient documentation

## 2017-01-23 DIAGNOSIS — O99331 Smoking (tobacco) complicating pregnancy, first trimester: Secondary | ICD-10-CM | POA: Diagnosis not present

## 2017-01-23 DIAGNOSIS — Z3A08 8 weeks gestation of pregnancy: Secondary | ICD-10-CM | POA: Insufficient documentation

## 2017-01-23 DIAGNOSIS — O26891 Other specified pregnancy related conditions, first trimester: Secondary | ICD-10-CM | POA: Insufficient documentation

## 2017-01-23 DIAGNOSIS — R102 Pelvic and perineal pain: Secondary | ICD-10-CM | POA: Diagnosis present

## 2017-01-23 DIAGNOSIS — F17210 Nicotine dependence, cigarettes, uncomplicated: Secondary | ICD-10-CM | POA: Diagnosis not present

## 2017-01-23 DIAGNOSIS — O9989 Other specified diseases and conditions complicating pregnancy, childbirth and the puerperium: Secondary | ICD-10-CM

## 2017-01-23 DIAGNOSIS — O09521 Supervision of elderly multigravida, first trimester: Secondary | ICD-10-CM | POA: Insufficient documentation

## 2017-01-23 DIAGNOSIS — Z3490 Encounter for supervision of normal pregnancy, unspecified, unspecified trimester: Secondary | ICD-10-CM

## 2017-01-23 LAB — URINALYSIS, ROUTINE W REFLEX MICROSCOPIC
BILIRUBIN URINE: NEGATIVE
Glucose, UA: NEGATIVE mg/dL
KETONES UR: NEGATIVE mg/dL
Nitrite: NEGATIVE
PROTEIN: NEGATIVE mg/dL
Specific Gravity, Urine: 1.018 (ref 1.005–1.030)
pH: 6 (ref 5.0–8.0)

## 2017-01-23 LAB — WET PREP, GENITAL
Sperm: NONE SEEN
YEAST WET PREP: NONE SEEN

## 2017-01-23 LAB — CBC
HCT: 38.9 % (ref 36.0–46.0)
Hemoglobin: 13.6 g/dL (ref 12.0–15.0)
MCH: 30.3 pg (ref 26.0–34.0)
MCHC: 35 g/dL (ref 30.0–36.0)
MCV: 86.6 fL (ref 78.0–100.0)
PLATELETS: 244 10*3/uL (ref 150–400)
RBC: 4.49 MIL/uL (ref 3.87–5.11)
RDW: 14.5 % (ref 11.5–15.5)
WBC: 5.6 10*3/uL (ref 4.0–10.5)

## 2017-01-23 LAB — POCT PREGNANCY, URINE: PREG TEST UR: POSITIVE — AB

## 2017-01-23 LAB — HCG, QUANTITATIVE, PREGNANCY: HCG, BETA CHAIN, QUANT, S: 67135 m[IU]/mL — AB (ref <5)

## 2017-01-23 MED ORDER — METRONIDAZOLE 500 MG PO TABS
2000.0000 mg | ORAL_TABLET | Freq: Once | ORAL | Status: AC
  Administered 2017-01-24: 2000 mg via ORAL
  Filled 2017-01-23: qty 4

## 2017-01-23 NOTE — MAU Provider Note (Signed)
History     CSN: [id number]  Arrival date and time: 01/23/17 2022   First Provider Initiated Contact with Patient 01/23/17 2208      Chief Complaint  Patient presents with  . Pelvic Pain   Pelvic Pain  The patient's primary symptoms include pelvic pain. The patient's pertinent negatives include no vaginal discharge. This is a new problem. Episode onset: 2 weeks ago. The problem occurs intermittently. The problem has been unchanged. Pain severity now: 3/10  The problem affects the left side. She is pregnant. Associated symptoms include dysuria and nausea. Pertinent negatives include no chills, fever, urgency or vomiting. The vaginal discharge was normal. There has been no bleeding. Nothing aggravates the symptoms. She has tried nothing for the symptoms. Her menstrual history has been regular (LMP 11/17/16 (approx)).   Past Medical History:  Diagnosis Date  . Hydradenitis   . No pertinent past medical history   . Sciatica     Past Surgical History:  Procedure Laterality Date  . DILATION AND CURETTAGE OF UTERUS    . IRRIGATION AND DEBRIDEMENT ABSCESS N/A 03/14/2016   Procedure: IRRIGATION AND DEBRIDEMENT ABSCESS;  Surgeon: Jackolyn Confer, MD;  Location: [facility];  Service: General;  Laterality: N/A;  . LAPAROSCOPY  05/26/2012   Procedure: LAPAROSCOPY OPERATIVE;  Surgeon: Mora Bellman, MD;  Location: [facility];  Service: Gynecology;  Laterality: N/A;  Operative Laparoscopy with Right Salpingectomy  . Sweat glands removed from rt. under arm Right     Family History  Problem Relation Age of Onset  . Other Neg Hx     Social History  Substance Use Topics  . Smoking status: Current Some Day Smoker    Packs/day: 0.50    Types: Cigarettes  . Smokeless tobacco: Not on file  . Alcohol use No    Allergies: No Known Allergies  Prescriptions Prior to Admission  Medication Sig Dispense Refill Last Dose  . benzocaine-menthol (CHLORASEPTIC) 6-10 MG lozenge Take 1 lozenge by mouth as  needed for sore throat. 100 tablet 0   . HYDROcodone-acetaminophen (NORCO/VICODIN) 5-325 MG tablet Take 1-2 tablets by mouth every 4 (four) hours as needed for moderate pain or severe pain. 30 tablet 0   . ibuprofen (ADVIL,MOTRIN) 200 MG tablet Take 400 mg by mouth every 6 (six) hours as needed for moderate pain.   Past Week at Unknown time  . naproxen (NAPROSYN) 500 MG tablet Take 1 tablet (500 mg total) by mouth 2 (two) times daily. 30 tablet 0   . norgestimate-ethinyl estradiol (ORTHO-CYCLEN,SPRINTEC,PREVIFEM) 0.25-35 MG-MCG tablet Take 1 tablet by mouth daily. (Patient not taking: Reported on 03/11/2015) 1 Package 11   . Pediatric Multiple Vit-C-FA (PEDIATRIC MULTIVITAMIN) chewable tablet Chew 2 tablets by mouth daily.   Past Month at Unknown time  . sulfamethoxazole-trimethoprim (BACTRIM DS,SEPTRA DS) 800-160 MG tablet Take 1 tablet by mouth 2 (two) times daily. 10 tablet 0     Review of Systems  Constitutional: Negative for chills and fever.  Gastrointestinal: Positive for nausea. Negative for vomiting.  Genitourinary: Positive for dysuria and pelvic pain. Negative for urgency, vaginal bleeding and vaginal discharge.   Physical Exam   Blood pressure 111/70, pulse 82, temperature 98.3 F (36.8 C), temperature source Oral, resp. rate 18, height 5\' 9"  (1.753 m), weight 229 lb (103.9 kg), last menstrual period 11/17/2016.  Physical Exam  Nursing note and vitals reviewed. Constitutional: She is oriented to person, place, and time. She appears well-developed and well-nourished. No distress.  HENT:  Head: Normocephalic.  Cardiovascular: Normal rate.   Respiratory: Effort normal.  GI: Soft. There is no tenderness. There is no rebound.  Neurological: She is alert and oriented to person, place, and time.  Skin: Skin is warm and dry.  Psychiatric: She has a normal mood and affect.   Results for orders placed or performed during the hospital encounter of 01/23/17 (from the past 24 hour(s))   Urinalysis, Routine w reflex microscopic     Status: Abnormal   Collection Time: 01/23/17  8:36 PM  Result Value Ref Range   Color, Urine YELLOW YELLOW   APPearance HAZY (A) CLEAR   Specific Gravity, Urine 1.018 1.005 - 1.030   pH 6.0 5.0 - 8.0   Glucose, UA NEGATIVE NEGATIVE mg/dL   Hgb urine dipstick SMALL (A) NEGATIVE   Bilirubin Urine NEGATIVE NEGATIVE   Ketones, ur NEGATIVE NEGATIVE mg/dL   Protein, ur NEGATIVE NEGATIVE mg/dL   Nitrite NEGATIVE NEGATIVE   Leukocytes, UA TRACE (A) NEGATIVE   RBC / HPF 0-5 0 - 5 RBC/hpf   WBC, UA 6-30 0 - 5 WBC/hpf   Bacteria, UA RARE (A) NONE SEEN   Squamous Epithelial / LPF 0-5 (A) NONE SEEN   Mucous PRESENT   Pregnancy, urine POC     Status: Abnormal   Collection Time: 01/23/17  8:49 PM  Result Value Ref Range   Preg Test, Ur POSITIVE (A) NEGATIVE  Wet prep, genital     Status: Abnormal   Collection Time: 01/23/17 10:15 PM  Result Value Ref Range   Yeast Wet Prep HPF POC NONE SEEN NONE SEEN   Trich, Wet Prep PRESENT (A) NONE SEEN   Clue Cells Wet Prep HPF POC PRESENT (A) NONE SEEN   WBC, Wet Prep HPF POC FEW (A) NONE SEEN   Sperm NONE SEEN   CBC     Status: None   Collection Time: 01/23/17 10:34 PM  Result Value Ref Range   WBC 5.6 4.0 - 10.5 K/uL   RBC 4.49 3.87 - 5.11 MIL/uL   Hemoglobin 13.6 12.0 - 15.0 g/dL   HCT 38.9 36.0 - 46.0 %   MCV 86.6 78.0 - 100.0 fL   MCH 30.3 26.0 - 34.0 pg   MCHC 35.0 30.0 - 36.0 g/dL   RDW 14.5 11.5 - 15.5 %   Platelets 244 150 - 400 K/uL  hCG, quantitative, pregnancy     Status: Abnormal   Collection Time: 01/23/17 10:34 PM  Result Value Ref Range   hCG, Beta Chain, Quant, S 67,135 (H) <5 mIU/mL   US Ob Comp Less 14 Wks  Result Date: 01/23/2017 CLINICAL DATA:  Initial evaluation for dysuria, pain with urination for few weeks. Currently pregnant. EXAM: OBSTETRIC <14 WK [place] AND TRANSVAGINAL OB US TECHNIQUE: Both transabdominal and transvaginal ultrasound examinations were performed for complete  evaluation of the gestation as well as the maternal uterus, adnexal regions, and pelvic cul-de-sac. Transvaginal technique was performed to assess early pregnancy. COMPARISON:  None available. FINDINGS: Intrauterine gestational sac: Single Yolk sac:  Present Embryo:  Present Cardiac Activity: Present Heart Rate: 177  bpm MSD:   mm    w     d CRL:  16  mm   8 w   0 d                  [place] EDC: 09/04/2017 Subchorionic hemorrhage:  None visualized. Maternal uterus/adnexae: 4-5 fibroids present within the uterus, largest of which located within the left lateral uterine body  and measured 3.3 x 2.8 x 2.3 cm. Right anterior fibroid measuring 2.3 x 1.9 x 1.9 cm is positioned close to the gestational sac. Additional 2.1 x 2.5 x 1.8 cm fibroid noted at the uterine fundus. Ovaries demonstrate a normal appearance bilaterally. No abnormality within either adnexa. No free fluid. IMPRESSION: 1. Single viable intrauterine pregnancy as above without complication. 2. Fibroid uterus as above. 3. No other acute abnormality within the pelvis. Electronically Signed   By: Jeannine Boga M.D.   On: 01/23/2017 23:51   US Ob Transvaginal  Result Date: 01/23/2017 CLINICAL DATA:  Initial evaluation for dysuria, pain with urination for few weeks. Currently pregnant. EXAM: OBSTETRIC <14 WK [place] AND TRANSVAGINAL OB US TECHNIQUE: Both transabdominal and transvaginal ultrasound examinations were performed for complete evaluation of the gestation as well as the maternal uterus, adnexal regions, and pelvic cul-de-sac. Transvaginal technique was performed to assess early pregnancy. COMPARISON:  None available. FINDINGS: Intrauterine gestational sac: Single Yolk sac:  Present Embryo:  Present Cardiac Activity: Present Heart Rate: 177  bpm MSD:   mm    w     d CRL:  16  mm   8 w   0 d                  [place] EDC: 09/04/2017 Subchorionic hemorrhage:  None visualized. Maternal uterus/adnexae: 4-5 fibroids present within the uterus, largest of which  located within the left lateral uterine body and measured 3.3 x 2.8 x 2.3 cm. Right anterior fibroid measuring 2.3 x 1.9 x 1.9 cm is positioned close to the gestational sac. Additional 2.1 x 2.5 x 1.8 cm fibroid noted at the uterine fundus. Ovaries demonstrate a normal appearance bilaterally. No abnormality within either adnexa. No free fluid. IMPRESSION: 1. Single viable intrauterine pregnancy as above without complication. 2. Fibroid uterus as above. 3. No other acute abnormality within the pelvis. Electronically Signed   By: Jeannine Boga M.D.   On: 01/23/2017 23:51    MAU Course  Procedures  MDM Patient treated here in MAU with 2g flagyl. Partner treatment offered.   Assessment and Plan   1. Intrauterine pregnancy   2. Pelvic pain in pregnancy, antepartum, first trimester   3. [redacted] weeks gestation of pregnancy   4. Trichomoniasis    DC home UC, GC/CT pending Pregnancy verification letter given Comfort measures reviewed  1st Trimester precautions  RX: none  Partner treatment 2g flagyl for: Barney Drain  Return to MAU as needed FU with OB as planned  Follow-up Information    [facility] Follow up.   Contact information: [place] [phone]            Mathis Bud 01/23/2017, 10:12 PM

## 2017-01-23 NOTE — Discharge Instructions (Signed)
First Trimester of Pregnancy  The first trimester of pregnancy is from week 1 until the end of week 12 (months 1 through 3). A week after a sperm fertilizes an egg, the egg will implant on the wall of the uterus. This embryo will begin to develop into a baby. Genes from you and your partner are forming the baby. The female genes determine whether the baby is a boy or a girl. At 6-8 weeks, the eyes and face are formed, and the heartbeat can be seen on ultrasound. At the end of 12 weeks, all the baby's organs are formed.   Now that you are pregnant, you will want to do everything you can to have a healthy baby. Two of the most important things are to get good prenatal care and to follow your health care provider's instructions. Prenatal care is all the medical care you receive before the baby's birth. This care will help prevent, find, and treat any problems during the pregnancy and childbirth.  BODY CHANGES  Your body goes through many changes during pregnancy. The changes vary from woman to woman.   · You may gain or lose a couple of pounds at first.  · You may feel sick to your stomach (nauseous) and throw up (vomit). If the vomiting is uncontrollable, call your health care provider.  · You may tire easily.  · You may develop headaches that can be relieved by medicines approved by your health care provider.  · You may urinate more often. Painful urination may mean you have a bladder infection.  · You may develop heartburn as a result of your pregnancy.  · You may develop constipation because certain hormones are causing the muscles that push waste through your intestines to slow down.  · You may develop hemorrhoids or swollen, bulging veins (varicose veins).  · Your breasts may begin to grow larger and become tender. Your nipples may stick out more, and the tissue that surrounds them (areola) may become darker.  · Your gums may bleed and may be sensitive to brushing and flossing.   · Dark spots or blotches (chloasma, mask of pregnancy) may develop on your face. This will likely fade after the baby is born.  · Your menstrual periods will stop.  · You may have a loss of appetite.  · You may develop cravings for certain kinds of food.  · You may have changes in your emotions from day to day, such as being excited to be pregnant or being concerned that something may go wrong with the pregnancy and baby.  · You may have more vivid and strange dreams.  · You may have changes in your hair. These can include thickening of your hair, rapid growth, and changes in texture. Some women also have hair loss during or after pregnancy, or hair that feels dry or thin. Your hair will most likely return to normal after your baby is born.  WHAT TO EXPECT AT YOUR PRENATAL VISITS  During a routine prenatal visit:  · You will be weighed to make sure you and the baby are growing normally.  · Your blood pressure will be taken.  · Your abdomen will be measured to track your baby's growth.  · The fetal heartbeat will be listened to starting around week 10 or 12 of your pregnancy.  · Test results from any previous visits will be discussed.  Your health care provider may ask you:  · How you are feeling.  · If you   are feeling the baby move.  · If you have had any abnormal symptoms, such as leaking fluid, bleeding, severe headaches, or abdominal cramping.  · If you are using any tobacco products, including cigarettes, chewing tobacco, and electronic cigarettes.  · If you have any questions.  Other tests that may be performed during your first trimester include:  · Blood tests to find your blood type and to check for the presence of any previous infections. They will also be used to check for low iron levels (anemia) and Rh antibodies. Later in the pregnancy, blood tests for diabetes will be done along with other tests if problems develop.  · Urine tests to check for infections, diabetes, or protein in the urine.   · An ultrasound to confirm the proper growth and development of the baby.  · An amniocentesis to check for possible genetic problems.  · Fetal screens for spina bifida and Down syndrome.  · You may need other tests to make sure you and the baby are doing well.  · HIV (human immunodeficiency virus) testing. Routine prenatal testing includes screening for HIV, unless you choose not to have this test.  HOME CARE INSTRUCTIONS   Medicines  · Follow your health care provider's instructions regarding medicine use. Specific medicines may be either safe or unsafe to take during pregnancy.  · Take your prenatal vitamins as directed.  · If you develop constipation, try taking a stool softener if your health care provider approves.  Diet  · Eat regular, well-balanced meals. Choose a variety of foods, such as meat or vegetable-based protein, fish, milk and low-fat dairy products, vegetables, fruits, and whole grain breads and cereals. Your health care provider will help you determine the amount of weight gain that is right for you.  · Avoid raw meat and uncooked cheese. These carry germs that can cause birth defects in the baby.  · Eating four or five small meals rather than three large meals a day may help relieve nausea and vomiting. If you start to feel nauseous, eating a few soda crackers can be helpful. Drinking liquids between meals instead of during meals also seems to help nausea and vomiting.  · If you develop constipation, eat more high-fiber foods, such as fresh vegetables or fruit and whole grains. Drink enough fluids to keep your urine clear or pale yellow.  Activity and Exercise  · Exercise only as directed by your health care provider. Exercising will help you:    Control your weight.    Stay in shape.    Be prepared for labor and delivery.  · Experiencing pain or cramping in the lower abdomen or low back is a good sign that you should stop exercising. Check with your health care provider  before continuing normal exercises.  · Try to avoid standing for long periods of time. Move your legs often if you must stand in one place for a long time.  · Avoid heavy lifting.  · Wear low-heeled shoes, and practice good posture.  · You may continue to have sex unless your health care provider directs you otherwise.  Relief of Pain or Discomfort  · Wear a good support bra for breast tenderness.    · Take warm sitz baths to soothe any pain or discomfort caused by hemorrhoids. Use hemorrhoid cream if your health care provider approves.    · Rest with your legs elevated if you have leg cramps or low back pain.  · If you develop varicose veins in your   legs, wear support hose. Elevate your feet for 15 minutes, 3-4 times a day. Limit salt in your diet.  Prenatal Care  · Schedule your prenatal visits by the twelfth week of pregnancy. They are usually scheduled monthly at first, then more often in the last 2 months before delivery.  · Write down your questions. Take them to your prenatal visits.  · Keep all your prenatal visits as directed by your health care provider.  Safety  · Wear your seat belt at all times when driving.  · Make a list of emergency phone numbers, including numbers for family, friends, the hospital, and police and fire departments.  General Tips  · Ask your health care provider for a referral to a local prenatal education class. Begin classes no later than at the beginning of month 6 of your pregnancy.  · Ask for help if you have counseling or nutritional needs during pregnancy. Your health care provider can offer advice or refer you to specialists for help with various needs.  · Do not use hot tubs, steam rooms, or saunas.  · Do not douche or use tampons or scented sanitary pads.  · Do not cross your legs for long periods of time.  · Avoid cat litter boxes and soil used by cats. These carry germs that can cause birth defects in the baby and possibly loss of the fetus by miscarriage or stillbirth.   · Avoid all smoking, herbs, alcohol, and medicines not prescribed by your health care provider. Chemicals in these affect the formation and growth of the baby.  · Do not use any tobacco products, including cigarettes, chewing tobacco, and electronic cigarettes. If you need help quitting, ask your health care provider. You may receive counseling support and other resources to help you quit.  · Schedule a dentist appointment. At home, brush your teeth with a soft toothbrush and be gentle when you floss.  SEEK MEDICAL CARE IF:   · You have dizziness.  · You have mild pelvic cramps, pelvic pressure, or nagging pain in the abdominal area.  · You have persistent nausea, vomiting, or diarrhea.  · You have a bad smelling vaginal discharge.  · You have pain with urination.  · You notice increased swelling in your face, hands, legs, or ankles.  SEEK IMMEDIATE MEDICAL CARE IF:   · You have a fever.  · You are leaking fluid from your vagina.  · You have spotting or bleeding from your vagina.  · You have severe abdominal cramping or pain.  · You have rapid weight gain or loss.  · You vomit blood or material that looks like coffee grounds.  · You are exposed to German measles and have never had them.  · You are exposed to fifth disease or chickenpox.  · You develop a severe headache.  · You have shortness of breath.  · You have any kind of trauma, such as from a fall or a car accident.     This information is not intended to replace advice given to you by your health care provider. Make sure you discuss any questions you have with your health care provider.     Document Released: 11/16/2001 Document Revised: 12/13/2014 Document Reviewed: 10/02/2013  [employer] Interactive Patient Education ©2017 [employer]

## 2017-01-23 NOTE — MAU Note (Signed)
Pt presents stating she had a positive HPT last week with an LMP sometime in December or January. Pt states she is also having pain with urination and her urine is concentrated. Sometimes has abdominal pain. Denies vaginal bleeding or discharge.

## 2017-01-24 ENCOUNTER — Encounter: Payer: Self-pay | Admitting: Advanced Practice Midwife

## 2017-01-24 LAB — GC/CHLAMYDIA PROBE AMP (~~LOC~~) NOT AT ARMC
Chlamydia: NEGATIVE
NEISSERIA GONORRHEA: NEGATIVE

## 2017-01-26 LAB — CULTURE, OB URINE

## 2017-01-27 ENCOUNTER — Telehealth: Payer: Self-pay | Admitting: Certified Nurse Midwife

## 2017-01-27 MED ORDER — NITROFURANTOIN MONOHYD MACRO 100 MG PO CAPS: 100.0000 mg | ORAL_CAPSULE | Freq: Two times a day (BID) | ORAL | 0 refills | Status: AC

## 2017-01-27 NOTE — Telephone Encounter (Signed)
Notified of +UTI. Rx sent. Increase water intake. Follow up with OB provider in 2-3 weeks.

## 2017-03-24 ENCOUNTER — Other Ambulatory Visit (HOSPITAL_COMMUNITY): Payer: Self-pay | Admitting: Nurse Practitioner

## 2017-03-24 DIAGNOSIS — O09529 Supervision of elderly multigravida, unspecified trimester: Secondary | ICD-10-CM

## 2017-03-24 DIAGNOSIS — Z363 Encounter for antenatal screening for malformations: Secondary | ICD-10-CM

## 2017-03-24 DIAGNOSIS — O99810 Abnormal glucose complicating pregnancy: Secondary | ICD-10-CM

## 2017-03-24 LAB — OB RESULTS CONSOLE HGB/HCT, BLOOD
HEMATOCRIT: 39 %
HEMOGLOBIN: 12.7 g/dL

## 2017-03-24 LAB — OB RESULTS CONSOLE HIV ANTIBODY (ROUTINE TESTING): HIV: NONREACTIVE

## 2017-03-24 LAB — OB RESULTS CONSOLE RPR: RPR: NONREACTIVE

## 2017-03-24 LAB — SICKLE CELL SCREEN: SICKLE CELL SCREEN: NORMAL

## 2017-03-24 LAB — OB RESULTS CONSOLE VARICELLA ZOSTER ANTIBODY, IGG: Varicella: IMMUNE

## 2017-03-24 LAB — OB RESULTS CONSOLE ABO/RH: RH Type: POSITIVE

## 2017-03-24 LAB — OB RESULTS CONSOLE GC/CHLAMYDIA
CHLAMYDIA, DNA PROBE: NEGATIVE
GC PROBE AMP, GENITAL: NEGATIVE

## 2017-03-24 LAB — HM PAP SMEAR: Pap: NEGATIVE

## 2017-03-24 LAB — OB RESULTS CONSOLE PLATELET COUNT: PLATELETS: 284 10*3/uL

## 2017-03-24 LAB — OB RESULTS CONSOLE HEPATITIS B SURFACE ANTIGEN: Hepatitis B Surface Ag: NEGATIVE

## 2017-03-24 LAB — OB RESULTS CONSOLE RUBELLA ANTIBODY, IGM: Rubella: IMMUNE

## 2017-03-24 LAB — OB RESULTS CONSOLE ANTIBODY SCREEN: ANTIBODY SCREEN: NEGATIVE

## 2017-04-01 ENCOUNTER — Encounter: Payer: Self-pay | Admitting: *Deleted

## 2017-04-04 ENCOUNTER — Other Ambulatory Visit: Payer: Self-pay

## 2017-04-11 ENCOUNTER — Encounter: Payer: Self-pay | Admitting: Family Medicine

## 2017-04-11 ENCOUNTER — Ambulatory Visit (INDEPENDENT_AMBULATORY_CARE_PROVIDER_SITE_OTHER): Payer: Medicaid Other | Admitting: Family Medicine

## 2017-04-11 ENCOUNTER — Encounter: Payer: Medicaid Other | Attending: Family Medicine | Admitting: *Deleted

## 2017-04-11 ENCOUNTER — Ambulatory Visit: Payer: Medicaid Other | Admitting: *Deleted

## 2017-04-11 VITALS — BP 108/66 | HR 80 | Wt 233.0 lb

## 2017-04-11 DIAGNOSIS — O0990 Supervision of high risk pregnancy, unspecified, unspecified trimester: Secondary | ICD-10-CM | POA: Insufficient documentation

## 2017-04-11 DIAGNOSIS — F331 Major depressive disorder, recurrent, moderate: Secondary | ICD-10-CM | POA: Diagnosis not present

## 2017-04-11 DIAGNOSIS — O24410 Gestational diabetes mellitus in pregnancy, diet controlled: Secondary | ICD-10-CM

## 2017-04-11 DIAGNOSIS — O0992 Supervision of high risk pregnancy, unspecified, second trimester: Secondary | ICD-10-CM | POA: Diagnosis not present

## 2017-04-11 DIAGNOSIS — Z3A15 15 weeks gestation of pregnancy: Secondary | ICD-10-CM | POA: Insufficient documentation

## 2017-04-11 DIAGNOSIS — O24419 Gestational diabetes mellitus in pregnancy, unspecified control: Secondary | ICD-10-CM | POA: Insufficient documentation

## 2017-04-11 DIAGNOSIS — O09522 Supervision of elderly multigravida, second trimester: Secondary | ICD-10-CM

## 2017-04-11 DIAGNOSIS — Z713 Dietary counseling and surveillance: Secondary | ICD-10-CM | POA: Insufficient documentation

## 2017-04-11 DIAGNOSIS — O09529 Supervision of elderly multigravida, unspecified trimester: Secondary | ICD-10-CM | POA: Insufficient documentation

## 2017-04-11 LAB — POCT URINALYSIS DIP (DEVICE)
GLUCOSE, UA: NEGATIVE mg/dL
Hgb urine dipstick: NEGATIVE
Ketones, ur: NEGATIVE mg/dL
LEUKOCYTES UA: NEGATIVE
Nitrite: NEGATIVE
PROTEIN: NEGATIVE mg/dL
Specific Gravity, Urine: >=1.03 (ref 1.005–1.030)
UROBILINOGEN UA: 0.2 mg/dL (ref 0.0–1.0)
pH: 5.5 (ref 5.0–8.0)

## 2017-04-11 MED ORDER — ACCU-CHEK AVIVA PLUS W/DEVICE KIT: 1.0000 [IU] | PACK | 0 refills | Status: DC

## 2017-04-11 MED ORDER — GLUCOSE BLOOD VI STRP: ORAL_STRIP | 3 refills | Status: DC

## 2017-04-11 MED ORDER — ASPIRIN 81 MG PO CHEW: 81.0000 mg | CHEWABLE_TABLET | Freq: Every day | ORAL | 2 refills | Status: DC

## 2017-04-11 MED ORDER — ACCU-CHEK FASTCLIX LANCETS MISC: 1.0000 [IU] | Freq: Four times a day (QID) | 12 refills | Status: DC

## 2017-04-11 NOTE — Patient Instructions (Addendum)
Following an appropriate diet and keeping your blood sugar under control is the most important thing to do for your health and that of your unborn baby.  Please check your blood sugar 4 times daily.  Please keep accurate BS logs and bring them with you to every visit.  Please bring your meter also.  Goals for Blood sugar should be: 1. Fasting (first thing in the morning before eating) should be less than 90.   2.  2 hours after meals should be less than 120.  Please eat 3 meals and 3 snacks.  Include protein (meat, dairy-cheese, eggs, nuts) with all meals.  Be mindful that carbohydrates increase your blood sugar.  Not just sweet food (cookies, cake, donuts, fruit, juice, soda) but also bread, pasta, rice, and potatoes.  You have to limit how many carbs you are eating.  Adding exercise, as little as 30 minutes a day or 10 minutes of walking following each meal can decrease your blood sugar.   Second Trimester of Pregnancy The second trimester is from week 14 through week 27 (months 4 through 6). The second trimester is often a time when you feel your best. Your body has adjusted to being pregnant, and you begin to feel better physically. Usually, morning sickness has lessened or quit completely, you may have more energy, and you may have an increase in appetite. The second trimester is also a time when the fetus is growing rapidly. At the end of the sixth month, the fetus is about 9 inches long and weighs about 1 pounds. You will likely begin to feel the baby move (quickening) between 16 and 20 weeks of pregnancy. Body changes during your second trimester Your body continues to go through many changes during your second trimester. The changes vary from woman to woman.  Your weight will continue to increase. You will notice your lower abdomen bulging out.  You may begin to get stretch marks on your hips, abdomen, and breasts.  You may develop headaches that can be relieved by medicines. The  medicines should be approved by your health care provider.  You may urinate more often because the fetus is pressing on your bladder.  You may develop or continue to have heartburn as a result of your pregnancy.  You may develop constipation because certain hormones are causing the muscles that push waste through your intestines to slow down.  You may develop hemorrhoids or swollen, bulging veins (varicose veins).  You may have back pain. This is caused by:  Weight gain.  Pregnancy hormones that are relaxing the joints in your pelvis.  A shift in weight and the muscles that support your balance.  Your breasts will continue to grow and they will continue to become tender.  Your gums may bleed and may be sensitive to brushing and flossing.  Dark spots or blotches (chloasma, mask of pregnancy) may develop on your face. This will likely fade after the baby is born.  A dark line from your belly button to the pubic area (linea nigra) may appear. This will likely fade after the baby is born.  You may have changes in your hair. These can include thickening of your hair, rapid growth, and changes in texture. Some women also have hair loss during or after pregnancy, or hair that feels dry or thin. Your hair will most likely return to normal after your baby is born. What to expect at prenatal visits During a routine prenatal visit:  You will be weighed  to make sure you and the fetus are growing normally.  Your blood pressure will be taken.  Your abdomen will be measured to track your baby's growth.  The fetal heartbeat will be listened to.  Any test results from the previous visit will be discussed. Your health care provider may ask you:  How you are feeling.  If you are feeling the baby move.  If you have had any abnormal symptoms, such as leaking fluid, bleeding, severe headaches, or abdominal cramping.  If you are using any tobacco products, including cigarettes, chewing  tobacco, and electronic cigarettes.  If you have any questions. Other tests that may be performed during your second trimester include:  Blood tests that check for:  Low iron levels (anemia).  High blood sugar that affects pregnant women (gestational diabetes) between 38 and 28 weeks.  Rh antibodies. This is to check for a protein on red blood cells (Rh factor).  Urine tests to check for infections, diabetes, or protein in the urine.  An ultrasound to confirm the proper growth and development of the baby.  An amniocentesis to check for possible genetic problems.  Fetal screens for spina bifida and Down syndrome.  HIV (human immunodeficiency virus) testing. Routine prenatal testing includes screening for HIV, unless you choose not to have this test. Follow these instructions at home: Medicines   Follow your health care provider's instructions regarding medicine use. Specific medicines may be either safe or unsafe to take during pregnancy.  Take a prenatal vitamin that contains at least 600 micrograms (mcg) of folic acid.  If you develop constipation, try taking a stool softener if your health care provider approves. Eating and drinking   Eat a balanced diet that includes fresh fruits and vegetables, whole grains, good sources of protein such as meat, eggs, or tofu, and low-fat dairy. Your health care provider will help you determine the amount of weight gain that is right for you.  Avoid raw meat and uncooked cheese. These carry germs that can cause birth defects in the baby.  If you have low calcium intake from food, talk to your health care provider about whether you should take a daily calcium supplement.  Limit foods that are high in fat and processed sugars, such as fried and sweet foods.  To prevent constipation:  Drink enough fluid to keep your urine clear or pale yellow.  Eat foods that are high in fiber, such as fresh fruits and vegetables, whole grains, and  beans. Activity   Exercise only as directed by your health care provider. Most women can continue their usual exercise routine during pregnancy. Try to exercise for 30 minutes at least 5 days a week. Stop exercising if you experience uterine contractions.  Avoid heavy lifting, wear low heel shoes, and practice good posture.  A sexual relationship may be continued unless your health care provider directs you otherwise. Relieving pain and discomfort   Wear a good support bra to prevent discomfort from breast tenderness.  Take warm sitz baths to soothe any pain or discomfort caused by hemorrhoids. Use hemorrhoid cream if your health care provider approves.  Rest with your legs elevated if you have leg cramps or low back pain.  If you develop varicose veins, wear support hose. Elevate your feet for 15 minutes, 3-4 times a day. Limit salt in your diet. Prenatal Care   Write down your questions. Take them to your prenatal visits.  Keep all your prenatal visits as told by your health care  provider. This is important. Safety   Wear your seat belt at all times when driving.  Make a list of emergency phone numbers, including numbers for family, friends, the hospital, and police and fire departments. General instructions   Ask your health care provider for a referral to a local prenatal education class. Begin classes no later than the beginning of month 6 of your pregnancy.  Ask for help if you have counseling or nutritional needs during pregnancy. Your health care provider can offer advice or refer you to specialists for help with various needs.  Do not use hot tubs, steam rooms, or saunas.  Do not douche or use tampons or scented sanitary pads.  Do not cross your legs for long periods of time.  Avoid cat litter boxes and soil used by cats. These carry germs that can cause birth defects in the baby and possibly loss of the fetus by miscarriage or stillbirth.  Avoid all smoking, herbs,  alcohol, and unprescribed drugs. Chemicals in these products can affect the formation and growth of the baby.  Do not use any products that contain nicotine or tobacco, such as cigarettes and e-cigarettes. If you need help quitting, ask your health care provider.  Visit your dentist if you have not gone yet during your pregnancy. Use a soft toothbrush to brush your teeth and be gentle when you floss. Contact a health care provider if:  You have dizziness.  You have mild pelvic cramps, pelvic pressure, or nagging pain in the abdominal area.  You have persistent nausea, vomiting, or diarrhea.  You have a bad smelling vaginal discharge.  You have pain when you urinate. Get help right away if:  You have a fever.  You are leaking fluid from your vagina.  You have spotting or bleeding from your vagina.  You have severe abdominal cramping or pain.  You have rapid weight gain or weight loss.  You have shortness of breath with chest pain.  You notice sudden or extreme swelling of your face, hands, ankles, feet, or legs.  You have not felt your baby move in over an hour.  You have severe headaches that do not go away when you take medicine.  You have vision changes. Summary  The second trimester is from week 14 through week 27 (months 4 through 6). It is also a time when the fetus is growing rapidly.  Your body goes through many changes during pregnancy. The changes vary from woman to woman.  Avoid all smoking, herbs, alcohol, and unprescribed drugs. These chemicals affect the formation and growth your baby.  Do not use any tobacco products, such as cigarettes, chewing tobacco, and e-cigarettes. If you need help quitting, ask your health care provider.  Contact your health care provider if you have any questions. Keep all prenatal visits as told by your health care provider. This is important. This information is not intended to replace advice given to you by your health care  provider. Make sure you discuss any questions you have with your health care provider. Document Released: 11/16/2001 Document Revised: 04/29/2016 Document Reviewed: 01/23/2013 Elsevier Interactive Patient Education  2017 [employer].     Breastfeeding Deciding to breastfeed is one of the best choices you can make for you and your baby. A change in hormones during pregnancy causes your breast tissue to grow and increases the number and size of your milk ducts. These hormones also allow proteins, sugars, and fats from your blood supply to make breast milk  in your milk-producing glands. Hormones prevent breast milk from being released before your baby is born as well as prompt milk flow after birth. Once breastfeeding has begun, thoughts of your baby, as well as his or her sucking or crying, can stimulate the release of milk from your milk-producing glands. Benefits of breastfeeding For Your Baby  Your first milk (colostrum) helps your baby's digestive system function better.  There are antibodies in your milk that help your baby fight off infections.  Your baby has a lower incidence of asthma, allergies, and sudden infant death syndrome.  The nutrients in breast milk are better for your baby than infant formulas and are designed uniquely for your baby's needs.  Breast milk improves your baby's brain development.  Your baby is less likely to develop other conditions, such as childhood obesity, asthma, or type 2 diabetes mellitus. For You  Breastfeeding helps to create a very special bond between you and your baby.  Breastfeeding is convenient. Breast milk is always available at the correct temperature and costs nothing.  Breastfeeding helps to burn calories and helps you lose the weight gained during pregnancy.  Breastfeeding makes your uterus contract to its prepregnancy size faster and slows bleeding (lochia) after you give birth.  Breastfeeding helps to lower your risk of  developing type 2 diabetes mellitus, osteoporosis, and breast or ovarian cancer later in life. Signs that your baby is hungry Early Signs of Hunger  Increased alertness or activity.  Stretching.  Movement of the head from side to side.  Movement of the head and opening of the mouth when the corner of the mouth or cheek is stroked (rooting).  Increased sucking sounds, smacking lips, cooing, sighing, or squeaking.  Hand-to-mouth movements.  Increased sucking of fingers or hands. Late Signs of Hunger  Fussing.  Intermittent crying. Extreme Signs of Hunger  Signs of extreme hunger will require calming and consoling before your baby will be able to breastfeed successfully. Do not wait for the following signs of extreme hunger to occur before you initiate breastfeeding:  Restlessness.  A loud, strong cry.  Screaming. Breastfeeding basics  Breastfeeding Initiation  Find a comfortable place to sit or lie down, with your neck and back well supported.  Place a pillow or rolled up blanket under your baby to bring him or her to the level of your breast (if you are seated). Nursing pillows are specially designed to help support your arms and your baby while you breastfeed.  Make sure that your baby's abdomen is facing your abdomen.  Gently massage your breast. With your fingertips, massage from your chest wall toward your nipple in a circular motion. This encourages milk flow. You may need to continue this action during the feeding if your milk flows slowly.  Support your breast with 4 fingers underneath and your thumb above your nipple. Make sure your fingers are well away from your nipple and your baby's mouth.  Stroke your baby's lips gently with your finger or nipple.  When your baby's mouth is open wide enough, quickly bring your baby to your breast, placing your entire nipple and as much of the colored area around your nipple (areola) as possible into your baby's mouth.  More  areola should be visible above your baby's upper lip than below the lower lip.  Your baby's tongue should be between his or her lower gum and your breast.  Ensure that your baby's mouth is correctly positioned around your nipple (latched). Your baby's lips should  create a seal on your breast and be turned out (everted).  It is common for your baby to suck about 2-3 minutes in order to start the flow of breast milk. Latching  Teaching your baby how to latch on to your breast properly is very important. An improper latch can cause nipple pain and decreased milk supply for you and poor weight gain in your baby. Also, if your baby is not latched onto your nipple properly, he or she may swallow some air during feeding. This can make your baby fussy. Burping your baby when you switch breasts during the feeding can help to get rid of the air. However, teaching your baby to latch on properly is still the best way to prevent fussiness from swallowing air while breastfeeding. Signs that your baby has successfully latched on to your nipple:  Silent tugging or silent sucking, without causing you pain.  Swallowing heard between every 3-4 sucks.  Muscle movement above and in front of his or her ears while sucking. Signs that your baby has not successfully latched on to nipple:  Sucking sounds or smacking sounds from your baby while breastfeeding.  Nipple pain. If you think your baby has not latched on correctly, slip your finger into the corner of your baby's mouth to break the suction and place it between your baby's gums. Attempt breastfeeding initiation again. Signs of Successful Breastfeeding  Signs from your baby:  A gradual decrease in the number of sucks or complete cessation of sucking.  Falling asleep.  Relaxation of his or her body.  Retention of a small amount of milk in his or her mouth.  Letting go of your breast by himself or herself. Signs from you:  Breasts that have increased  in firmness, weight, and size 1-3 hours after feeding.  Breasts that are softer immediately after breastfeeding.  Increased milk volume, as well as a change in milk consistency and color by the fifth day of breastfeeding.  Nipples that are not sore, cracked, or bleeding. Signs That Your [employer] is Getting Enough Milk  Wetting at least 1-2 diapers during the first 24 hours after birth.  Wetting at least 5-6 diapers every 24 hours for the first week after birth. The urine should be clear or pale yellow by 5 days after birth.  Wetting 6-8 diapers every 24 hours as your baby continues to grow and develop.  At least 3 stools in a 24-hour period by age 292 days. The stool should be soft and yellow.  At least 3 stools in a 24-hour period by age 29 days. The stool should be seedy and yellow.  No loss of weight greater than 10% of birth weight during the first 1 days of age.  Average weight gain of 4-7 ounces (113-198 g) per week after age 63 days.  Consistent daily weight gain by age 1 days, without weight loss after the age of 2 weeks. After a feeding, your baby may spit up a small amount. This is common. Breastfeeding frequency and duration Frequent feeding will help you make more milk and can prevent sore nipples and breast engorgement. Breastfeed when you feel the need to reduce the fullness of your breasts or when your baby shows signs of hunger. This is called "breastfeeding on demand." Avoid introducing a pacifier to your baby while you are working to establish breastfeeding (the first 4-6 weeks after your baby is born). After this time you may choose to use a pacifier. Research has shown that pacifier  use during the first year of a baby's life decreases the risk of sudden infant death syndrome (SIDS). Allow your baby to feed on each breast as long as he or she wants. Breastfeed until your baby is finished feeding. When your baby unlatches or falls asleep while feeding from the first breast, offer  the second breast. Because newborns are often sleepy in the first few weeks of life, you may need to awaken your baby to get him or her to feed. Breastfeeding times will vary from baby to baby. However, the following rules can serve as a guide to help you ensure that your baby is properly fed:  Newborns (babies 55 weeks of age or younger) may breastfeed every 1-3 hours.  Newborns should not go longer than 3 hours during the day or 5 hours during the night without breastfeeding.  You should breastfeed your baby a minimum of 8 times in a 24-hour period until you begin to introduce solid foods to your baby at around 35 months of age. Breast milk pumping Pumping and storing breast milk allows you to ensure that your baby is exclusively fed your breast milk, even at times when you are unable to breastfeed. This is especially important if you are going back to work while you are still breastfeeding or when you are not able to be present during feedings. Your lactation consultant can give you guidelines on how long it is safe to store breast milk. A breast pump is a machine that allows you to pump milk from your breast into a sterile bottle. The pumped breast milk can then be stored in a refrigerator or freezer. Some breast pumps are operated by hand, while others use electricity. Ask your lactation consultant which type will work best for you. Breast pumps can be purchased, but some hospitals and breastfeeding support groups lease breast pumps on a monthly basis. A lactation consultant can teach you how to hand express breast milk, if you prefer not to use a pump. Caring for your breasts while you breastfeed Nipples can become dry, cracked, and sore while breastfeeding. The following recommendations can help keep your breasts moisturized and healthy:  Avoid using soap on your nipples.  Wear a supportive bra. Although not required, special nursing bras and tank tops are designed to allow access to your  breasts for breastfeeding without taking off your entire bra or top. Avoid wearing underwire-style bras or extremely tight bras.  Air dry your nipples for 3-31minutes after each feeding.  Use only cotton bra pads to absorb leaked breast milk. Leaking of breast milk between feedings is normal.  Use lanolin on your nipples after breastfeeding. Lanolin helps to maintain your skin's normal moisture barrier. If you use pure lanolin, you do not need to wash it off before feeding your baby again. Pure lanolin is not toxic to your baby. You may also hand express a few drops of breast milk and gently massage that milk into your nipples and allow the milk to air dry. In the first few weeks after giving birth, some women experience extremely full breasts (engorgement). Engorgement can make your breasts feel heavy, warm, and tender to the touch. Engorgement peaks within 3-5 days after you give birth. The following recommendations can help ease engorgement:  Completely empty your breasts while breastfeeding or pumping. You may want to start by applying warm, moist heat (in the shower or with warm water-soaked hand towels) just before feeding or pumping. This increases circulation and helps the milk  flow. If your baby does not completely empty your breasts while breastfeeding, pump any extra milk after he or she is finished.  Wear a snug bra (nursing or regular) or tank top for 1-2 days to signal your body to slightly decrease milk production.  Apply ice packs to your breasts, unless this is too uncomfortable for you.  Make sure that your baby is latched on and positioned properly while breastfeeding. If engorgement persists after 48 hours of following these recommendations, contact your health care provider or a Science writer. Overall health care recommendations while breastfeeding  Eat healthy foods. Alternate between meals and snacks, eating 3 of each per day. Because what you eat affects your breast  milk, some of the foods may make your baby more irritable than usual. Avoid eating these foods if you are sure that they are negatively affecting your baby.  Drink milk, fruit juice, and water to satisfy your thirst (about 10 glasses a day).  Rest often, relax, and continue to take your prenatal vitamins to prevent fatigue, stress, and anemia.  Continue breast self-awareness checks.  Avoid chewing and smoking tobacco. Chemicals from cigarettes that pass into breast milk and exposure to secondhand smoke may harm your baby.  Avoid alcohol and drug use, including marijuana. Some medicines that may be harmful to your baby can pass through breast milk. It is important to ask your health care provider before taking any medicine, including all over-the-counter and prescription medicine as well as vitamin and herbal supplements. It is possible to become pregnant while breastfeeding. If birth control is desired, ask your health care provider about options that will be safe for your baby. Contact a health care provider if:  You feel like you want to stop breastfeeding or have become frustrated with breastfeeding.  You have painful breasts or nipples.  Your nipples are cracked or bleeding.  Your breasts are red, tender, or warm.  You have a swollen area on either breast.  You have a fever or chills.  You have nausea or vomiting.  You have drainage other than breast milk from your nipples.  Your breasts do not become full before feedings by the fifth day after you give birth.  You feel sad and depressed.  Your baby is too sleepy to eat well.  Your baby is having trouble sleeping.  Your baby is wetting less than 3 diapers in a 24-hour period.  Your baby has less than 3 stools in a 24-hour period.  Your baby's skin or the white part of his or her eyes becomes yellow.  Your baby is not gaining weight by 89 days of age. Get help right away if:  Your baby is overly tired (lethargic) and  does not want to wake up and feed.  Your baby develops an unexplained fever. This information is not intended to replace advice given to you by your health care provider. Make sure you discuss any questions you have with your health care provider. Document Released: 11/22/2005 Document Revised: 05/05/2016 Document Reviewed: 05/16/2013 Elsevier Interactive Patient Education  2017 [employer].

## 2017-04-11 NOTE — Progress Notes (Signed)
   PRENATAL VISIT NOTE  Subjective:  Renee Schroeder is a 41 y.o. [id number] at 63w1dbeing seen today for transferring prenatal care.  She is currently monitored for the following issues for this high-risk pregnancy and has Supervision of high risk pregnancy, antepartum; Gestational diabetes mellitus; and AMA (advanced maternal age) multigravida 35+ on her problem list.  Patient reports being under a lot of stress around job, housing, feeling depressed.  Contractions: Not present. Vag. Bleeding: None.  Movement: Present. Denies leaking of fluid.   The following portions of the patient's history were reviewed and updated as appropriate: allergies, current medications, past family history, past medical history, past social history, past surgical history and problem list. Problem list updated.  Objective:   Vitals:   04/11/17 0815  BP: 108/66  Pulse: 80  Weight: 233 lb (105.7 kg)    Fetal Status: Fetal Heart Rate (bpm): 160   Movement: Present     General:  Alert, oriented and cooperative. Patient is in no acute distress.  Skin: Skin is warm and dry. No rash noted.   Cardiovascular: Normal heart rate noted  Respiratory: Normal respiratory effort, no problems with respiration noted  Abdomen: Soft, gravid, appropriate for gestational age. Pain/Pressure: Absent     Pelvic:  Cervical exam deferred        Extremities: Normal range of motion.  Edema: None  Mental Status: Normal mood and affect. Normal behavior. Normal judgment and thought content.   Assessment and Plan:  Pregnancy: [id number] 156w1d1. Supervision of high risk pregnancy, antepartum Continue prenatal care.   2. Diet controlled gestational diabetes mellitus (GDM) in second trimester Baseline labs Meet with educator today--goals and diet and exercise reviewed - Comprehensive metabolic panel - TSH - Hemoglobin A1c - Protein / creatinine ratio, urine - aspirin 81 MG chewable tablet; Chew 1 tablet (81 mg total) by mouth  daily.  Dispense: 60 tablet; Refill: 2 - glucose blood (ACCU-CHEK AVIVA PLUS) test strip; Use to check Blood Sugars 4 times/day  Dispense: 120 each; Refill: 3 - ACCU-CHEK FASTCLIX LANCETS MISC; 1 Units by Percutaneous route 4 (four) times daily.  Dispense: 100 each; Refill: 12 - Blood Glucose Monitoring Suppl (ACCU-CHEK AVIVA PLUS) w/Device KIT; 1 Units by Does not apply route as directed.  Dispense: 1 kit; Refill: 0  3. Elderly multigravida in second trimester Normal Quad--for detailed anatomy this week  4. Moderate episode of recurrent major depressive disorder (HCSolonTo see Jamie--did not really seem interested in meds - Ambulatory referral to [place] precautions including but not limited to vaginal bleeding, contractions, leaking of fluid and fetal movement were reviewed in detail with the patient. Please refer to After Visit Summary for other counseling recommendations.  Return in 2 weeks (on 04/25/2017).   PrDonnamae JudeMD

## 2017-04-11 NOTE — BH Specialist Note (Deleted)
Integrated Behavioral Health Initial Visit  MRN: [id number] Name: Renee Schroeder   Session Start time: *** Session End time: *** Total time: {IBH Total Time:21014050}  Type of Service: [place] Interpretor:No. Interpretor Name and Language: n/a   Warm Hand Off Completed.       SUBJECTIVE: Renee Schroeder is a 41 y.o. female accompanied by Neighbor/friend. Patient was referred by Dr Kennon Rounds for depression, anxiety. Patient reports the following symptoms/concerns: *** Duration of problem: ***; Severity of problem: severe  OBJECTIVE: Mood: {BHH MOOD:22306} and Affect: {BHH AFFECT:22307} Risk of harm to self or others: {CHL AMB BH Suicide Current Mental Status:21022748}   LIFE CONTEXT: Family and Social: *** School/Work: *** Self-Care: *** Life Changes: Current pregnancy ***  GOALS ADDRESSED: Patient will reduce symptoms of: anxiety and depression and increase knowledge and/or ability of: coping skills and also: {IBH Goals:21014053}   INTERVENTIONS: {IBH Interventions:21014054}  Standardized Assessments completed: GAD-7 and PHQ 2&9 with C-SSRS  ASSESSMENT: Patient currently experiencing ***. Patient may benefit from ***.  PLAN: 1. Follow up with behavioral health clinician on : *** 2. Behavioral recommendations: *** 3. Referral(s): {IBH Referrals:21014055} 4. "From scale of 1-10, how likely are you to follow plan?": ***  Garlan Fair, LCSWA  Depression screen [facility] 2/9 04/11/2017  Decreased Interest 2  Down, Depressed, Hopeless 3  PHQ - 2 Score 5  Altered sleeping 2  Tired, decreased energy 2  Change in appetite 1  Feeling bad or failure about yourself  3  Trouble concentrating 1  Moving slowly or fidgety/restless 0  Suicidal thoughts 2  PHQ-9 Score 16   GAD 7 : Generalized Anxiety Score 04/11/2017  Nervous, Anxious, on Edge 2  Control/stop worrying 3  Worry too much - different things 3  Trouble relaxing 3   Restless 3  Easily annoyed or irritable 3  Afraid - awful might happen 3  Total GAD 7 Score 20

## 2017-04-11 NOTE — Progress Notes (Signed)
Patient and/or legal guardian verbally consented to meet with Behavioral Health Clinician about presenting concerns.   

## 2017-04-11 NOTE — Progress Notes (Signed)
  Patient was seen on 04/11/2017 for Gestational Diabetes self-management.  Patient is [redacted] weeks pregnant, EDD: 09/04/2017. States no history of GDM, diet history obtained and her eating habits appear appropriate most of the time. She does drink regular soda on occasion. The following learning objectives were met by the patient :   States the definition of Gestational Diabetes  States why dietary management is important in controlling blood glucose  Describes the effects of carbohydrates on blood glucose levels  Demonstrates ability to create a balanced meal plan  Demonstrates carbohydrate counting   States when to check blood glucose levels  Demonstrates proper blood glucose monitoring techniques  States the effect of stress and exercise on blood glucose levels  States the importance of limiting caffeine and abstaining from alcohol and smoking  Plan:  Aim for 3 Carb Choices per meal (45 grams) +/- 1 either way  Aim for 1-2 Carbs per snack Begin reading food labels for Total Carbohydrate of foods Consider  increasing your activity level by walking or other activity daily as tolerated Begin checking BG before breakfast and 2 hours after first bite of breakfast, lunch and dinner as directed by MD  Bring Log Book to every medical appointment   Take medication if directed by MD  Blood glucose monitor Rx called into pharmacy: Accu Chek Guide with Fast Clix drums Patient instructed to test pre breakfast and 2 hours each meal as directed by MD  Patient instructed to monitor glucose levels: FBS: 60 - <90 2 hour: <120  Patient received the following handouts:  Nutrition Diabetes and Pregnancy  Carbohydrate Counting List  Patient will be seen for follow-up as needed.

## 2017-04-12 LAB — HEMOGLOBIN A1C
Est. average glucose Bld gHb Est-mCnc: 126 mg/dL
Hgb A1c MFr Bld: 6 % — ABNORMAL HIGH (ref 4.8–5.6)

## 2017-04-12 LAB — COMPREHENSIVE METABOLIC PANEL
ALBUMIN: 3.5 g/dL (ref 3.5–5.5)
ALK PHOS: 77 IU/L (ref 39–117)
ALT: 30 IU/L (ref 0–32)
AST: 19 IU/L (ref 0–40)
Albumin/Globulin Ratio: 1.1 — ABNORMAL LOW (ref 1.2–2.2)
BUN / CREAT RATIO: 10 (ref 9–23)
BUN: 6 mg/dL (ref 6–24)
Bilirubin Total: <0.2 mg/dL (ref 0.0–1.2)
CHLORIDE: 106 mmol/L (ref 96–106)
CO2: 20 mmol/L (ref 18–29)
Calcium: 9.6 mg/dL (ref 8.7–10.2)
Creatinine, Ser: 0.62 mg/dL (ref 0.57–1.00)
GFR calc Af Amer: 129 mL/min/{1.73_m2} (ref >59)
GFR calc non Af Amer: 112 mL/min/{1.73_m2} (ref >59)
GLOBULIN, TOTAL: 3.1 g/dL (ref 1.5–4.5)
GLUCOSE: 94 mg/dL (ref 65–99)
POTASSIUM: 4.4 mmol/L (ref 3.5–5.2)
Sodium: 137 mmol/L (ref 134–144)
Total Protein: 6.6 g/dL (ref 6.0–8.5)

## 2017-04-12 LAB — PROTEIN / CREATININE RATIO, URINE
Creatinine, Urine: 249.1 mg/dL
PROTEIN/CREAT RATIO: 87 mg/g{creat} (ref 0–200)
Protein, Ur: 21.7 mg/dL

## 2017-04-12 LAB — TSH: TSH: 1.19 u[IU]/mL (ref 0.450–4.500)

## 2017-04-13 ENCOUNTER — Other Ambulatory Visit: Payer: Self-pay | Admitting: *Deleted

## 2017-04-13 ENCOUNTER — Ambulatory Visit (HOSPITAL_COMMUNITY)
Admission: RE | Admit: 2017-04-13 | Discharge: 2017-04-13 | Disposition: A | Discharge status: Home / Self Care | Payer: Medicaid Other | Source: Ambulatory Visit | Attending: Nurse Practitioner | Admitting: Nurse Practitioner

## 2017-04-13 ENCOUNTER — Encounter (HOSPITAL_COMMUNITY): Payer: Self-pay

## 2017-04-13 ENCOUNTER — Other Ambulatory Visit (HOSPITAL_COMMUNITY): Payer: Self-pay | Admitting: Nurse Practitioner

## 2017-04-13 VITALS — BP 104/62 | HR 87 | Wt 234.4 lb

## 2017-04-13 DIAGNOSIS — Z3A19 19 weeks gestation of pregnancy: Secondary | ICD-10-CM | POA: Diagnosis not present

## 2017-04-13 DIAGNOSIS — D259 Leiomyoma of uterus, unspecified: Secondary | ICD-10-CM | POA: Insufficient documentation

## 2017-04-13 DIAGNOSIS — O321XX0 Maternal care for breech presentation, not applicable or unspecified: Secondary | ICD-10-CM | POA: Diagnosis not present

## 2017-04-13 DIAGNOSIS — O09522 Supervision of elderly multigravida, second trimester: Secondary | ICD-10-CM | POA: Diagnosis not present

## 2017-04-13 DIAGNOSIS — O3412 Maternal care for benign tumor of corpus uteri, second trimester: Secondary | ICD-10-CM | POA: Insufficient documentation

## 2017-04-13 DIAGNOSIS — O24812 Other pre-existing diabetes mellitus in pregnancy, second trimester: Secondary | ICD-10-CM

## 2017-04-13 DIAGNOSIS — O09529 Supervision of elderly multigravida, unspecified trimester: Secondary | ICD-10-CM

## 2017-04-13 DIAGNOSIS — O99810 Abnormal glucose complicating pregnancy: Secondary | ICD-10-CM

## 2017-04-13 DIAGNOSIS — Z363 Encounter for antenatal screening for malformations: Secondary | ICD-10-CM

## 2017-04-13 DIAGNOSIS — O24410 Gestational diabetes mellitus in pregnancy, diet controlled: Secondary | ICD-10-CM | POA: Diagnosis present

## 2017-04-13 NOTE — Progress Notes (Signed)
Appointment Date:  DOB: 12-14-1975 Referring Provider: Jolaine Click, NP Attending:  Ms. ANUSHREE DORSI was seen for genetic counseling because of a maternal age of 41 y.o..     In summary:  Discussed maternal age and associated risks for fetal aneuploidy  Reviewed normal results of Quad screen  Reduction in risk for Down syndrome and Trisomy 18  Not screening for other fetal aneuploidies  Reviewed results of ultrasound  No fetal anomalies or markers seen  Reduction in risk for fetal aneuploidy  Offered additional testing and screening  Declined NIPS  Declined amniocentesis  Reviewed family history concerns  Advanced paternal age - declined additional screening  Discussed carrier screening options - declined  CF  SMA  Hemoglobinopathies  She was counseled regarding maternal age and the association with risk for chromosome conditions due to nondisjunction with aging of the ova.   We reviewed chromosomes, nondisjunction, and the associated 1 in 4 risk for fetal aneuploidy related to a maternal age of 41 y.o. at [redacted]w[redacted]d gestation.  She was counseled that the risk for aneuploidy decreases as gestational age increases, accounting for those pregnancies which spontaneously abort.  We specifically discussed Down syndrome (trisomy 28), trisomies 66 and 57, and sex chromosome aneuploidies (47,XXX and 47,XXY) including the common features and prognoses of each.   We discussed that the Quad screen is able to adjust a person's baseline (age related) chance for specific chromosome conditions.  Based on this screen, her chance for Down syndrome was reduced from her age related chance of 1 in 55 to 1 in 55; the Quad screen also reduced the chance for Trisomy 8 from her age related chance to 1 in 3963.   She understands that Quad screen can not adjust her age related chance for sex chromosome aneuploidies or Trisomy 72.  She was counseled that 50-80% of fetuses with Down syndrome and  up to 90% of fetuses with trisomies 36 and 18, when well visualized, have detectable anomalies or soft markers by ultrasound. A complete ultrasound was performed today. The ultrasound report will be sent under separate cover. There were no visualized fetal anomalies or markers suggestive of aneuploidy.   We discussed the option of noninvasive prenatal screening (NIPS)/cell free DNA (cfDNA) screening.  She was counseled that this screening test can provide a pregnancy specific risk assessment. We reviewed the benefits and limitations of this screening option. Specifically, we discussed the conditions for which the test screens, the detection rates, and false positive rates for each. She was also counseled regarding diagnostic testing via amniocentesis. We reviewed the approximate 1 in 161-096 risk for complications for amniocentesis, including spontaneous pregnancy loss.   Additional screening and diagnostic testing were declined today.  She understands that screening tests, including ultrasound and Quad screen, cannot rule out all birth defects or genetic syndromes. The patient was advised of this limitation and states she still does not want additional testing or screening at this time.   Ms. Mirabal was provided with written information regarding cystic fibrosis (CF), spinal muscular atrophy (SMA) and hemoglobinopathies including the carrier frequency, availability of carrier screening and prenatal diagnosis if indicated.  In addition, we discussed that CF and hemoglobinopathies are routinely screened for as part of the [facility] newborn screening panel and screening for SMA will be available in June on an "opt-in" basis.  After further discussion, she declined screening for CF, SMA and hemoglobinopathies.  She is aware that her screening for hemoglobin S was previously performed and was  normal.  Both family histories were reviewed and found to be noncontributory for birth defects, intellectual disability, and  known genetic conditions.  However, Ms. Sandy reported that the father of the baby is 60 years of age.  We discussed that advanced paternal age is defined as paternal age greater than or equal to age 40.  Recent large-scale sequencing studies have shown that approximately 80% of de novo point mutations are of paternal origin.  Many studies have demonstrated a strong correlation between increased paternal age and de novo point mutations.  It is estimated that the overall chance for a de novo mutation is approximately 0.5%.  We also discussed the wide range of conditions which can be caused by new dominant gene mutations (achondroplasia, neurofibromatosis, Marfan syndrome etc.). She was counseled that genetic testing for each individual single gene condition is not warranted or available unless ultrasound or concerns lend suspicion to a specific condition.  However, there is another NIPS platform ([facility] through [place]) that is able to assess for specific mutations in a panel of 30 selected genes covering 26 conditions.  Most of these conditions follow an autosomal dominant pattern of inheritance and typically occur due to de novo gene mutations.  The detection rates for these conditions vary depending upon the specific condition but range from 43% to 96%.  Therefore, this screening would not identify all new dominant gene mutations.  She declined additional screening with [facility].  She was also counseled that some literature suggests that APA is also associated with an increase in risk for fetal aneuploidy.  While other literature does not support this, we discussed that a specific risk for aneuploidy other than that based on maternal age cannot be quantified. Lastly, we discussed that newer literature suggests that the risk for autism spectrum disorders (ASD) may be increased in children born to fathers of APA.  We discussed that ASDs are among the most common neurodevelopmental disorders, with approximately 1 in  85 children meeting criteria for ASD.  Approximately 80% of individuals diagnosed are female.  There is strong evidence that genetic factors play a critical role in development of ASD.  While there have been recent advances in identifying specific genetic causes of ASD, there are still many individuals for whom the etiology of the ASD is not known.  She understands that at this time there is no reliable, comprehensive genetic testing available for ASD and a specific paternal age related risk can not be quantified.  Without further information regarding the provided family history, an accurate genetic risk cannot be calculated. Further genetic counseling is warranted if more information is obtained.  Ms. Krawczyk denied exposure to environmental toxins or chemical agents. She denied the use of alcohol, tobacco or street drugs. She denied significant viral illnesses during the course of her pregnancy. Her medical and surgical histories were noncontributory.   I counseled Ms. Moist regarding the above risks and available options.  The approximate face-to-face time with the genetic counselor was 50 minutes.  Cam Hai, MS,  Certified Genetic Counselor

## 2017-04-20 ENCOUNTER — Encounter: Payer: Self-pay | Admitting: *Deleted

## 2017-04-21 ENCOUNTER — Ambulatory Visit: Payer: Self-pay

## 2017-04-28 ENCOUNTER — Encounter: Payer: Medicaid Other | Admitting: Clinical

## 2017-04-28 ENCOUNTER — Encounter: Payer: Self-pay | Admitting: Obstetrics and Gynecology

## 2017-04-28 ENCOUNTER — Ambulatory Visit (INDEPENDENT_AMBULATORY_CARE_PROVIDER_SITE_OTHER): Payer: Medicaid Other | Admitting: Obstetrics and Gynecology

## 2017-04-28 VITALS — BP 106/68 | HR 74 | Wt 228.8 lb

## 2017-04-28 DIAGNOSIS — O2342 Unspecified infection of urinary tract in pregnancy, second trimester: Secondary | ICD-10-CM

## 2017-04-28 DIAGNOSIS — O09522 Supervision of elderly multigravida, second trimester: Secondary | ICD-10-CM | POA: Diagnosis present

## 2017-04-28 DIAGNOSIS — O0990 Supervision of high risk pregnancy, unspecified, unspecified trimester: Secondary | ICD-10-CM

## 2017-04-28 DIAGNOSIS — O0992 Supervision of high risk pregnancy, unspecified, second trimester: Secondary | ICD-10-CM | POA: Diagnosis not present

## 2017-04-28 DIAGNOSIS — O24410 Gestational diabetes mellitus in pregnancy, diet controlled: Secondary | ICD-10-CM | POA: Diagnosis not present

## 2017-04-28 DIAGNOSIS — O24419 Gestational diabetes mellitus in pregnancy, unspecified control: Secondary | ICD-10-CM

## 2017-04-28 LAB — POCT URINALYSIS DIP (DEVICE)
BILIRUBIN URINE: NEGATIVE
GLUCOSE, UA: NEGATIVE mg/dL
KETONES UR: NEGATIVE mg/dL
Leukocytes, UA: NEGATIVE
Nitrite: NEGATIVE
Protein, ur: 30 mg/dL — AB
Specific Gravity, Urine: 1.025 (ref 1.005–1.030)
Urobilinogen, UA: 1 mg/dL (ref 0.0–1.0)
pH: 6.5 (ref 5.0–8.0)

## 2017-04-28 MED ORDER — ACCU-CHEK SOFTCLIX LANCET DEV KIT: 1.0000 [IU] | PACK | 0 refills | Status: DC

## 2017-04-28 MED ORDER — METFORMIN HCL 500 MG PO TABS: 500.0000 mg | ORAL_TABLET | Freq: Two times a day (BID) | ORAL | 5 refills | Status: DC

## 2017-04-28 MED ORDER — ASPIRIN 81 MG PO CHEW: 81.0000 mg | CHEWABLE_TABLET | Freq: Every day | ORAL | 3 refills | Status: DC

## 2017-04-28 NOTE — Progress Notes (Signed)
Prenatal Visit Note Date: 04/28/2017 Clinic: [facility]  Subjective:  Renee Schroeder is a 41 y.o. [id number] at [redacted]w[redacted]d being seen today for ongoing prenatal care.  She is currently monitored for the following issues for this high-risk pregnancy and has Supervision of high risk pregnancy, antepartum; Gestational diabetes mellitus; AMA (advanced maternal age) multigravida 35+; and Urinary tract infection in mother during second trimester of pregnancy on her problem list.  Patient reports issues with the soft clix lancet pen causing unable to get BS values.  Contractions: Not present. Vag. Bleeding: None.  Movement: Present. Denies leaking of fluid.   The following portions of the patient's history were reviewed and updated as appropriate: allergies, current medications, past family history, past medical history, past social history, past surgical history and problem list. Problem list updated.  Objective:   Vitals:   04/28/17 1000  BP: 106/68  Pulse: 74  Weight: 228 lb 12.8 oz (103.8 kg)    Fetal Status: Fetal Heart Rate (bpm): 154   Movement: Present     General:  Alert, oriented and cooperative. Patient is in no acute distress.  Skin: Skin is warm and dry. No rash noted.   Cardiovascular: Normal heart rate noted  Respiratory: Normal respiratory effort, no problems with respiration noted  Abdomen: Soft, gravid, appropriate for gestational age. Pain/Pressure: Present     Pelvic:  Cervical exam deferred        Extremities: Normal range of motion.  Edema: None  Mental Status: Normal mood and affect. Normal behavior. Normal judgment and thought content.   Urinalysis:      Assessment and Plan:  Pregnancy: [id number] at [redacted]w[redacted]d  1. Diet controlled gestational diabetes mellitus (GDM) in second trimester We tried to troubleshoot the device in clinic but it's not working. Pt states she called the clinic to let us know but didn't get a response. Paper Rx for another device  given to her. She's only been able to record 3 2hr PP values (135/209/170). I told her that based on these I'm pretty sure she's going to need insulin but I need more #s. Will start her on metformin 500 bidac for now and have her come back early next week. Pt told that if she's getting #s in the 200s with the metformin to call us to start insulin over the phone. Rx for baby ASA also given. Will schedule for fetal echo.  - Ambulatory referral to Pediatric Cardiology - aspirin 81 MG chewable tablet; Chew 1 tablet (81 mg total) by mouth daily.  Dispense: 90 tablet; Refill: 3  2. Elderly multigravida in second trimester S/p GC consult already - Ambulatory referral to Pediatric Cardiology  3. Urinary tract infection in mother during second trimester of pregnancy toc today - Culture, OB Urine   Preterm labor symptoms and general obstetric precautions including but not limited to vaginal bleeding, contractions, leaking of fluid and fetal movement were reviewed in detail with the patient. Please refer to After Visit Summary for other counseling recommendations.  Return in about 3 days (around 05/01/2017).   Aletha Halim, MD

## 2017-04-28 NOTE — Progress Notes (Signed)
Breastfeeding tip reviewed

## 2017-04-28 NOTE — BH Specialist Note (Deleted)
Integrated Behavioral Health Initial Visit  MRN: [id number] Name: Renee Schroeder   Session Start time: *** Session End time: *** Total time: {IBH Total Time:21014050}  Type of Service: [place] Interpretor:No. Interpretor Name and Language: n/a   Warm Hand Off Completed.       SUBJECTIVE: Renee Schroeder is a 41 y.o. female accompanied by patient. Patient was referred by Dr Ilda Basset for psychosocial, depression Patient reports the following symptoms/concerns: *** Duration of problem: ***; Severity of problem: {Mild/Moderate/Severe:20260}  OBJECTIVE: Mood: {BHH MOOD:22306} and Affect: {BHH AFFECT:22307} Risk of harm to self or others: {CHL AMB BH Suicide Current Mental Status:21022748}   LIFE CONTEXT: Family and Social: *** School/Work: *** Self-Care: *** Life Changes: ***  GOALS ADDRESSED: Patient will reduce symptoms of: {IBH Symptoms:21014056} and increase knowledge and/or ability of: self-management skills and also: Increase healthy adjustment to current life circumstances   INTERVENTIONS: {IBH Interventions:21014054}  Standardized Assessments completed: {IBH Screening Tools:21014051}  ASSESSMENT: Patient currently experiencing ***. Patient may benefit from ***.  PLAN: 1. Follow up with behavioral health clinician on : *** 2. Behavioral recommendations: *** 3. Referral(s): {IBH Referrals:21014055} 4. "From scale of 1-10, how likely are you to follow plan?": ***  Garlan Fair, LCSWA   Depression screen [facility] 2/9 04/11/2017  Decreased Interest 2  Down, Depressed, Hopeless 3  PHQ - 2 Score 5  Altered sleeping 2  Tired, decreased energy 2  Change in appetite 1  Feeling bad or failure about yourself  3  Trouble concentrating 1  Moving slowly or fidgety/restless 0  Suicidal thoughts 2  PHQ-9 Score 16   GAD 7 : Generalized Anxiety Score 04/11/2017  Nervous, Anxious, on Edge 2  Control/stop worrying 3  Worry too much  - different things 3  Trouble relaxing 3  Restless 3  Easily annoyed or irritable 3  Afraid - awful might happen 3  Total GAD 7 Score 20

## 2017-05-01 LAB — URINE CULTURE, OB REFLEX

## 2017-05-01 LAB — CULTURE, OB URINE

## 2017-05-03 ENCOUNTER — Telehealth: Payer: Self-pay | Admitting: *Deleted

## 2017-05-03 ENCOUNTER — Encounter: Payer: Self-pay | Admitting: Family Medicine

## 2017-05-03 NOTE — Progress Notes (Signed)
Pt scheduled for fetal echo 7/6 @ 1330.

## 2017-05-03 NOTE — Progress Notes (Signed)
This encounter was created in error - please disregard.

## 2017-05-03 NOTE — Telephone Encounter (Signed)
Called patient, no answer. Voice mail was left stating I am calling to notify her of her appointment for a fetal echocardiogram on Fri, 7/6 @ 1:30pm. Address and phone number for [facility] given. Asked patient to return my call if she has questions.

## 2017-05-11 ENCOUNTER — Ambulatory Visit (HOSPITAL_COMMUNITY)
Admission: RE | Admit: 2017-05-11 | Discharge: 2017-05-11 | Disposition: A | Discharge status: Home / Self Care | Payer: Medicaid Other | Source: Ambulatory Visit | Attending: Nurse Practitioner | Admitting: Nurse Practitioner

## 2017-05-11 ENCOUNTER — Encounter (HOSPITAL_COMMUNITY): Payer: Self-pay

## 2017-05-18 ENCOUNTER — Encounter: Payer: Self-pay | Admitting: Obstetrics and Gynecology

## 2017-05-18 ENCOUNTER — Telehealth: Payer: Self-pay | Admitting: Obstetrics and Gynecology

## 2017-05-18 NOTE — Telephone Encounter (Signed)
Called left message for patient informing that I was calling due to missed appointment. Also informed her of rescheduled appt and to call us if she is unable to attend.

## 2017-05-19 ENCOUNTER — Encounter: Payer: Self-pay | Admitting: Obstetrics and Gynecology

## 2017-05-19 NOTE — Progress Notes (Signed)
Patient did not keep OB appointment for 05/18/2017.  Durene Romans MD Attending Center for [employer] [facility])

## 2017-05-23 ENCOUNTER — Encounter: Payer: Self-pay | Admitting: Obstetrics and Gynecology

## 2017-05-23 ENCOUNTER — Telehealth: Payer: Self-pay | Admitting: Obstetrics and Gynecology

## 2017-05-23 NOTE — Progress Notes (Signed)
Patient did not keep OB appointment for 05/23/2017.  Durene Romans MD Attending Center for [employer] [facility] farm [facility])

## 2017-06-07 ENCOUNTER — Telehealth: Payer: Self-pay | Admitting: Family Medicine

## 2017-06-07 NOTE — Telephone Encounter (Signed)
Left message for patient informing patient that she has missed several appointments and that she should call us back asap to reschedule appt's or to inform office if she is seeking care elsewhere.

## 2017-06-09 ENCOUNTER — Ambulatory Visit (HOSPITAL_COMMUNITY): Admission: RE | Admit: 2017-06-09 | Payer: Medicaid Other | Source: Ambulatory Visit

## 2017-06-13 ENCOUNTER — Encounter: Payer: Self-pay | Admitting: Obstetrics & Gynecology

## 2017-06-15 ENCOUNTER — Ambulatory Visit (INDEPENDENT_AMBULATORY_CARE_PROVIDER_SITE_OTHER): Payer: Medicaid Other | Admitting: Advanced Practice Midwife

## 2017-06-15 VITALS — BP 104/68 | HR 88 | Wt 228.0 lb

## 2017-06-15 DIAGNOSIS — O2342 Unspecified infection of urinary tract in pregnancy, second trimester: Secondary | ICD-10-CM

## 2017-06-15 DIAGNOSIS — O09523 Supervision of elderly multigravida, third trimester: Secondary | ICD-10-CM

## 2017-06-15 DIAGNOSIS — O0990 Supervision of high risk pregnancy, unspecified, unspecified trimester: Secondary | ICD-10-CM

## 2017-06-15 DIAGNOSIS — O0993 Supervision of high risk pregnancy, unspecified, third trimester: Secondary | ICD-10-CM | POA: Diagnosis not present

## 2017-06-15 DIAGNOSIS — O2343 Unspecified infection of urinary tract in pregnancy, third trimester: Secondary | ICD-10-CM

## 2017-06-15 DIAGNOSIS — Z3A30 30 weeks gestation of pregnancy: Secondary | ICD-10-CM

## 2017-06-15 DIAGNOSIS — Z23 Encounter for immunization: Secondary | ICD-10-CM

## 2017-06-15 DIAGNOSIS — O24419 Gestational diabetes mellitus in pregnancy, unspecified control: Secondary | ICD-10-CM

## 2017-06-15 LAB — GLUCOSE, CAPILLARY: Glucose-Capillary: 93 mg/dL (ref 65–99)

## 2017-06-15 MED ORDER — PRENATAL VITAMINS 0.8 MG PO TABS: 1.0000 | ORAL_TABLET | Freq: Every day | ORAL | 12 refills | Status: DC

## 2017-06-15 NOTE — Progress Notes (Signed)
   PRENATAL VISIT NOTE  Subjective:  Renee Schroeder is a 41 y.o. [id number] at [redacted]w[redacted]d being seen today for ongoing prenatal care.  She is currently monitored for the following issues for this high-risk pregnancy and has Supervision of high risk pregnancy, antepartum; GDM, class A2; AMA (advanced maternal age) multigravida 35+; and Urinary tract infection in mother during second trimester of pregnancy on her problem list.  Patient reports no complaints.  Contractions: Not present. Vag. Bleeding: None.  Movement: Present. Denies leaking of fluid.   A2GDM on Metformin. Only taking when she "feels like her sugar is high", but has been very compliant w/Diabetes diet and has seen an improvement in CBGs. Also reports feeling much better on new diet.  No-showed to last 2 ROBs and US's.    The following portions of the patient's history were reviewed and updated as appropriate: allergies, current medications, past family history, past medical history, past social history, past surgical history and problem list. Problem list updated.  Objective:   Vitals:   06/15/17 0949  BP: 104/68  Pulse: 88  Weight: 228 lb (103.4 kg)    Fetal Status: Fetal Heart Rate (bpm): 165 Fundal Height: 29 cm Movement: Present     General:  Alert, oriented and cooperative. Patient is in no acute distress.  Skin: Skin is warm and dry. No rash noted.   Cardiovascular: Normal heart rate noted  Respiratory: Normal respiratory effort, no problems with respiration noted  Abdomen: Soft, gravid, appropriate for gestational age. Pain/Pressure: Present     Pelvic:  Cervical exam deferred        Extremities: Normal range of motion.  Edema: None  Mental Status: Normal mood and affect. Normal behavior. Normal judgment and thought content.   Did not bring log Fasting CBGs: Not checking (?% out of range) PCB/L/D:  117-120's (?% out of range). Rarely checking  Fasting CBG in office today 93.   Assessment and Plan:  Pregnancy:  [id number] at [redacted]w[redacted]d  1. GDM, class A2--Non-compliant, Unknown control - Lengthy discussion about importance of testing blood sugars, bringing log book and keeping appointments. Discussed risks of uncontrolled DM in pregnancy including delayed fetal lung maturity, IUFD and shoulder dystocia possibly resulting brachial plexus palsy, brain damage, intrapartum death and extensive obstetric lacerations. Patient verbalizes understanding.  - CBC - RPR - HIV antibody (with reflex) - Tdap vaccine greater than or equal to 7yo IM - Hemoglobin A1c - [place] MFM OB FOLLOW UP; Future - Random CBG today Nml  2. Elderly multigravida in third trimester  - CBC - RPR - HIV antibody (with reflex) - Tdap vaccine greater than or equal to 7yo IM - [place] MFM OB FOLLOW UP; Future  3. Supervision of high risk pregnancy, antepartum  - CBC - RPR - HIV antibody (with reflex) - Tdap vaccine greater than or equal to 7yo IM  4. Urinary tract infection in mother during second trimester of pregnancy   5. [redacted] weeks gestation of pregnancy  - [place] MFM OB FOLLOW UP; Future  Preterm labor symptoms and general obstetric precautions including but not limited to vaginal bleeding, contractions, leaking of fluid and fetal movement were reviewed in detail with the patient. Please refer to After Visit Summary for other counseling recommendations.  1 week RN CBG check  Return in about 2 weeks (around 06/29/2017) for ROB.   Manya Silvas, CNM

## 2017-06-15 NOTE — Patient Instructions (Addendum)
Gestational Diabetes Mellitus, Self Care Caring for yourself after you have been diagnosed with gestational diabetes (gestational diabetes mellitus) means keeping your blood sugar (glucose) under control with a balance of:  Nutrition.  Exercise.  Lifestyle changes.  Medicines or insulin, if necessary.  Support from your team of health care providers and others.  The following information explains what you need to know to manage your gestational diabetes at home. What do I need to do to manage my blood glucose?  Check your blood glucose every day during your pregnancy. Do this as often as told by your health care provider.  Contact your health care provider if your blood glucose is above your target for 2 tests in a row. Your health care provider will set individualized treatment goals for you. Generally, the goal of treatment is to maintain the following blood glucose levels during pregnancy:  After not eating for 8 hours (after fasting): at or below 95 mg/dL (5.3 mmol/L).  After meals (postprandial): ? One hour after a meal: at or below 140 mg/dL (7.8 mmol/L). ? Two hours after a meal: at or below 120 mg/dL (6.7 mmol/L).  A1c (hemoglobin A1c) level: 6-6.5%.  What do I need to know about hyperglycemia and hypoglycemia? What is hyperglycemia? Hyperglycemia, also called high blood glucose, occurs when blood glucose is too high. Make sure you know the early signs of hyperglycemia, such as:  Increased thirst.  Hunger.  Feeling very tired.  Needing to urinate more often than usual.  Blurry vision.  What is hypoglycemia? Hypoglycemia, also called low blood glucose, occurswith a blood glucose level at or below 70 mg/dL (3.9 mmol/L). The risk for hypoglycemia increases during or after exercise, during sleep, during illness, and when skipping meals or not eating for a long time (fasting). It is important to know the symptoms of hypoglycemia and treat it right away. Always have a  15-gram rapid-acting carbohydrate snack with you to treat low blood glucose.Family members and close friends should also know the symptoms and should understand how to treat hypoglycemia, in case you are not able to treat yourself. What are the symptoms of hypoglycemia? Hypoglycemia symptoms can include:  Hunger.  Anxiety.  Sweating and feeling clammy.  Confusion.  Dizziness or feeling light-headed.  Sleepiness.  Nausea.  Increased heart rate.  Headache.  Blurry vision.  Seizure.  Nightmares.  Tingling or numbness around the mouth, lips, or tongue.  A change in speech.  Decreased ability to concentrate.  A change in coordination.  Restless sleep.  Tremors or shakes.  Fainting.  Irritability.  How do I treat hypoglycemia?  If you are alert and able to swallow safely, follow the 15:15 rule:  Take 15 grams of a rapid-acting carbohydrate. Rapid-acting options include: ? 1 tube of glucose gel. ? 3 glucose pills. ? 6-8 pieces of hard candy. ? 4 oz (120 mL) of fruit juice. ? 4 oz (120 mL) of regular (not diet) soda.  Check your blood glucose 15 minutes after you take the carbohydrate.  If the repeat blood glucose level is still at or below 70 mg/dL (3.9 mmol/L), take 15 grams of a carbohydrate again.  If your blood glucose level does not increase above 70 mg/dL (3.9 mmol/L) after 3 tries, seek emergency medical care.  After your blood glucose level returns to normal, eat a meal or a snack within 1 hour.  How do I treat severe hypoglycemia? Severe hypoglycemia is when your blood glucose level is at or below 54 mg/dL (  3 mmol/L). Severe hypoglycemia is an emergency. Do not wait to see if the symptoms will go away. Get medical help right away. Call your local emergency services (911 in the [place] Do not drive yourself to the hospital. If you have severe hypoglycemia and you cannot eat or drink, you may need an injection of glucagon. A family member or close  friend should learn how to check your blood glucose and how to give you a glucagon injection. Ask your health care provider if you need to have an emergency glucagon injection kit available. Severe hypoglycemia may need to be treated in a hospital. The treatment may include getting glucose through an IV tube. You may also need treatment for the cause of your hypoglycemia. What else can I do to manage my gestational diabetes? Take your diabetes medicines as told  If your health care provider prescribed insulin or diabetes medicines, take them every day.  Do not run out of insulin or other diabetes medicines that you take. Plan ahead so you always have these available.  If you use insulin, adjust your dosage based on how physically active you are and what foods you eat. Your health care provider will tell you how to adjust your dosage. Make healthy food choices  The things that you eat and drink affect your blood glucose. Making good choices helps to control your diabetes and prevent other health problems. A healthy meal plan includes eating lean proteins, complex carbohydrates, fresh fruits and vegetables, low-fat dairy products, and healthy fats. Make an appointment to see a diet and nutrition specialist (registered dietitian) to help you create an eating plan that is right for you. Make sure that you:  Follow instructions from your health care provider about eating or drinking restrictions.  Drink enough fluid to keep your urine clear or pale yellow.  Eat healthy snacks between nutritious meals.  Track the carbohydrates that you eat. Do this by reading food labels and learning the standard serving sizes of foods.  Follow your sick day plan whenever you cannot eat or drink as usual. Make this plan in advance with your health care provider.  Stay active   Do at least 30 minutes of physical activity a day, or as much physical activity as your health care provider recommends during your  pregnancy. ? Doing 10 minutes of exercise 30 minutes after each meal may help to control postprandial blood glucose levels.  If you start a new exercise or activity, work with your health care provider to adjust your insulin, medicines, or food intake as needed. Make healthy lifestyle choices  Do not drink alcohol.  Do not use any tobacco products, such as cigarettes, chewing tobacco, and e-cigarettes. If you need help quitting, ask your health care provider.  Learn to manage stress. If you need help with this, ask your health care provider. Care for your body  Keep your immunizations up to date.  Brush your teeth and gums two times a day, and floss at least one time a day.  Visit your dentist at least once every 6 months.  Maintain a healthy weight during your pregnancy. General instructions   Take over-the-counter and prescription medicines only as told by your health care provider.  Talk with your health care provider about your risk for high blood pressure during pregnancy (preeclampsia or eclampsia).  Share your diabetes management plan with people in your workplace, school, and household.  Check your urine for ketones during your pregnancy when you are ill and   as told by your health care provider.  Carry a medical alert card or wear medical alert jewelry.  Ask your health care provider: ? Do I need to meet with a diabetes educator? ? Where can I find a support group for people with diabetes?  Keep all follow-up visits during your pregnancy (prenatal) and after delivery (postnatal) as told by your health care provider. This is important. Get the care that you need after delivery  Have your blood glucose level checked 4-12 weeks after delivery. This is done with an oral glucose tolerance test (OGTT).  Get screened for diabetes at least every 3 years, or as often as told by your health care provider. Where to find more information: To learn more about gestational  diabetes, visit:  American Diabetes Association (ADA): www.diabetes.org/diabetes-basics/gestational  Centers for Disease Control and Prevention (CDC): http://sanchez-watson.com/.pdf  This information is not intended to replace advice given to you by your health care provider. Make sure you discuss any questions you have with your health care provider. Document Released: 03/15/2016 Document Revised: 04/29/2016 Document Reviewed: 12/26/2015 Elsevier Interactive Patient Education  2017 Elsevier Inc[employer]   Postpartum Tubal Ligation Postpartum tubal ligation (PPTL) is a procedure to close the fallopian tubes. This is done so that you cannot get pregnant. When the fallopian tubes are closed, the eggs that the ovaries release cannot enter the uterus, and sperm cannot reach the eggs. PPTL is done right after childbirth or 1-2 days after childbirth, before the uterus returns to its normal location. PPTL is sometimes called "getting your tubes tied." You should not have this procedure if you want to get pregnant someday or if you are unsure about having more children. Tell a health care provider about: Any allergies you have. All medicines you are taking, including vitamins, herbs, eye drops, creams, and over-the-counter medicines. Previous problems you or members of your family have had with the use of anesthetics. Any blood disorders you have. Previous surgeries you have had. Any medical conditions you may have. Any past pregnancies. What are the risks? Generally, this is a safe procedure. However, problems may occur, including: Infection. Bleeding. Injury to surrounding organs. Side effects from anesthetics. Failure of the procedure.  This procedure can increase your risk of a kind of pregnancy in which a fertilized egg attaches to the outside of the uterus (ectopic pregnancy). What happens before the procedure? Ask your health care provider about: How much pain you can  expect to have. What medicines you will be given for pain, especially if you are planning to breastfeed. Follow instructions from your health care provider about eating and drinking restrictions. What happens during the procedure? If you had a vaginal delivery: You may be given one or more of the following: A medicine that helps you relax (sedative). A medicine to numb the area (local anesthetic). A medicine to make you fall asleep (general anesthetic). A medicine that is injected into an area of your body to numb everything below the injection site (regional anesthetic). If you have been given a general anesthetic, a tube will be put down your throat to help you breathe. An IV tube will be inserted into one of your veins to give you medicines and fluids during the procedure. Your bladder may be emptied with a small tube (catheter). An incision will be made just below your belly button. Your fallopian tubes will be located and brought up through the incision. Your fallopian tubes will be tied off, burned (cauterized), or blocked with a  clip, ring, or clamp. A small portion in the center of each fallopian tube may be removed. The incision will be closed with stitches (sutures). A bandage (dressing) will be placed over the incision.  If you had a cesarean delivery: Tubal ligation will be done through the incision that was used for the cesarean delivery of your baby. The incision will be closed with sutures. A dressing will be placed over the incision.  The procedure may vary among health care providers and hospitals. What happens after the procedure? Your blood pressure, heart rate, breathing rate, and blood oxygen level will be monitored often until the medicines you were given have worn off. You will be given pain medicine as needed. Do not drive for 24 hours if you received a sedative. This information is not intended to replace advice given to you by your health care provider. Make  sure you discuss any questions you have with your health care provider. Document Released: 11/22/2005 Document Revised: 04/26/2016 Document Reviewed: 11/02/2015 Elsevier Interactive Patient Education  [employer].

## 2017-06-16 ENCOUNTER — Encounter: Payer: Self-pay | Admitting: *Deleted

## 2017-06-16 LAB — CBC
HEMATOCRIT: 39.1 % (ref 34.0–46.6)
HEMOGLOBIN: 13.5 g/dL (ref 11.1–15.9)
MCH: 31.1 pg (ref 26.6–33.0)
MCHC: 34.5 g/dL (ref 31.5–35.7)
MCV: 90 fL (ref 79–97)
PLATELETS: 253 10*3/uL (ref 150–379)
RBC: 4.34 x10E6/uL (ref 3.77–5.28)
RDW: 15.8 % — ABNORMAL HIGH (ref 12.3–15.4)
WBC: 7.4 10*3/uL (ref 3.4–10.8)

## 2017-06-16 LAB — RPR: RPR Ser Ql: NONREACTIVE

## 2017-06-16 LAB — HIV ANTIBODY (ROUTINE TESTING W REFLEX): HIV Screen 4th Generation wRfx: NONREACTIVE

## 2017-06-16 LAB — HEMOGLOBIN A1C
ESTIMATED AVERAGE GLUCOSE: 120 mg/dL
Hgb A1c MFr Bld: 5.8 % — ABNORMAL HIGH (ref 4.8–5.6)

## 2017-06-29 ENCOUNTER — Ambulatory Visit (HOSPITAL_COMMUNITY)
Admission: RE | Admit: 2017-06-29 | Discharge: 2017-06-29 | Disposition: A | Discharge status: Home / Self Care | Payer: Medicaid Other | Source: Ambulatory Visit | Attending: Advanced Practice Midwife | Admitting: Advanced Practice Midwife

## 2017-06-29 DIAGNOSIS — O09523 Supervision of elderly multigravida, third trimester: Secondary | ICD-10-CM | POA: Insufficient documentation

## 2017-06-29 DIAGNOSIS — Z362 Encounter for other antenatal screening follow-up: Secondary | ICD-10-CM | POA: Insufficient documentation

## 2017-06-29 DIAGNOSIS — O24113 Pre-existing diabetes mellitus, type 2, in pregnancy, third trimester: Secondary | ICD-10-CM | POA: Diagnosis present

## 2017-06-29 DIAGNOSIS — Z3A30 30 weeks gestation of pregnancy: Secondary | ICD-10-CM | POA: Insufficient documentation

## 2017-06-29 DIAGNOSIS — O99213 Obesity complicating pregnancy, third trimester: Secondary | ICD-10-CM | POA: Insufficient documentation

## 2017-06-29 DIAGNOSIS — O24419 Gestational diabetes mellitus in pregnancy, unspecified control: Secondary | ICD-10-CM

## 2017-07-06 ENCOUNTER — Ambulatory Visit (INDEPENDENT_AMBULATORY_CARE_PROVIDER_SITE_OTHER): Payer: Medicaid Other | Admitting: Obstetrics and Gynecology

## 2017-07-06 ENCOUNTER — Telehealth: Payer: Self-pay | Admitting: General Practice

## 2017-07-06 VITALS — BP 108/68 | HR 93 | Wt 226.3 lb

## 2017-07-06 DIAGNOSIS — Z872 Personal history of diseases of the skin and subcutaneous tissue: Secondary | ICD-10-CM | POA: Insufficient documentation

## 2017-07-06 DIAGNOSIS — O09523 Supervision of elderly multigravida, third trimester: Secondary | ICD-10-CM

## 2017-07-06 DIAGNOSIS — O24419 Gestational diabetes mellitus in pregnancy, unspecified control: Secondary | ICD-10-CM

## 2017-07-06 DIAGNOSIS — O0993 Supervision of high risk pregnancy, unspecified, third trimester: Secondary | ICD-10-CM

## 2017-07-06 DIAGNOSIS — O2343 Unspecified infection of urinary tract in pregnancy, third trimester: Secondary | ICD-10-CM

## 2017-07-06 DIAGNOSIS — O0990 Supervision of high risk pregnancy, unspecified, unspecified trimester: Secondary | ICD-10-CM

## 2017-07-06 LAB — POCT URINALYSIS DIP (DEVICE)
BILIRUBIN URINE: NEGATIVE
GLUCOSE, UA: NEGATIVE mg/dL
Ketones, ur: NEGATIVE mg/dL
NITRITE: POSITIVE — AB
Protein, ur: 100 mg/dL — AB
Specific Gravity, Urine: 1.02 (ref 1.005–1.030)
UROBILINOGEN UA: 1 mg/dL (ref 0.0–1.0)
pH: 7 (ref 5.0–8.0)

## 2017-07-06 MED ORDER — METFORMIN HCL 500 MG PO TABS: 1000.0000 mg | ORAL_TABLET | Freq: Two times a day (BID) | ORAL | 5 refills | Status: DC

## 2017-07-06 MED ORDER — CEPHALEXIN 500 MG PO CAPS: 500.0000 mg | ORAL_CAPSULE | Freq: Three times a day (TID) | ORAL | 0 refills | Status: AC

## 2017-07-06 NOTE — Progress Notes (Signed)
Pt c/o boils in groin area U/A +nitrites, large leuk, mod blood, 2+ protein; pt having urinary pain and frequency x4 days

## 2017-07-06 NOTE — Patient Instructions (Signed)
Medication for UTI/Boils sent to pharmacy Increase metformin to 100mg  BID  Will start biweekly testing next week  [place] at 34 weeks for growth  Bring CBG log to next visit

## 2017-07-06 NOTE — Addendum Note (Signed)
Addended by: Riccardo Dubin on: 07/06/2017 03:18 PM   Modules accepted: Orders

## 2017-07-06 NOTE — Progress Notes (Signed)
Subjective:  Renee Schroeder is a 41 y.o. [id number] at 103w3d being seen today for ongoing prenatal care.  She is currently monitored for the following issues for this high-risk pregnancy and has Supervision of high risk pregnancy, antepartum; GDM, class A2; AMA (advanced maternal age) multigravida 35+; Urinary tract infection in mother during second trimester of pregnancy; and History of hidradenitis suppurativa on her problem list.  Patient reports dysuria and boils. Boils have been flaring for the last 2 weeks and the dysuria has been present for 4 days. Had UTI prior in pregnancy. Denies fevers. Contractions: Irritability. Vag. Bleeding: Scant.  Movement: Present. Denies leaking of fluid.   The following portions of the patient's history were reviewed and updated as appropriate: allergies, current medications, past family history, past medical history, past social history, past surgical history and problem list. Problem list updated.  Objective:  BP 108/68   Pulse 93   Wt 226 lb 4.8 oz (102.6 kg)   LMP 11/17/2016 (LMP Unknown)   BMI 33.42 kg/m   Fetal Status: Fetal Heart Rate (bpm): 140 Fundal Height: 31 cm Movement: Present     General:  Alert, oriented and cooperative. Patient is in no acute distress.  Skin: Skin is warm and dry. No rash noted.   Cardiovascular: Normal heart rate noted  Respiratory: Normal respiratory effort, no problems with respiration noted  Abdomen: Soft, gravid, appropriate for gestational age. Pain/Pressure: Present   Suprapubic discomfort  Pelvic: Vag. Bleeding: Scant   Inner thigh abscess x3 bilaterally with draining  Cervical exam deferred        Extremities: Normal range of motion.  Edema: None  Mental Status: Normal mood and affect. Normal behavior. Normal judgment and thought content.   Urinalysis: Urine Protein: 2+ Urine Glucose: Negative  Results for orders placed or performed in visit on 07/06/17  POCT urinalysis dip (device)  Result Value Ref Range   Glucose, UA NEGATIVE NEGATIVE mg/dL   Bilirubin Urine NEGATIVE NEGATIVE   Ketones, ur NEGATIVE NEGATIVE mg/dL   Specific Gravity, Urine 1.020 1.005 - 1.030   Hgb urine dipstick MODERATE (A) NEGATIVE   pH 7.0 5.0 - 8.0   Protein, ur 100 (A) NEGATIVE mg/dL   Urobilinogen, UA 1.0 0.0 - 1.0 mg/dL   Nitrite POSITIVE (A) NEGATIVE   Leukocytes, UA LARGE (A) NEGATIVE    Assessment and Plan:  Pregnancy: [id number] at [redacted]w[redacted]d  1. GDM, class A2 Still not recording blood sugars. Values she does give are elevated. Increase metformin to 1000mg  BID. May need to switch agents or add on agent at next visit if sugars still not controlled. Reiterated the risks of uncontrolled DM. Growth US performed last week. Will repeat in 3 weeks. Start antepartum testing next week.   2. Urinary tract infection in mother during third trimester of pregnancy UA consistent with UTI. This is the second UTI this pregnancy. No signs of pyelonephritis. Will treat with Keflex. Urine sent for culture.    3. Supervision of high risk pregnancy, antepartum Routine prenatal care. Reviewed 28wk labs and were normal.   4. Elderly multigravida in third trimester  5. History of hidradenitis suppurativa With current flare of boils. Currently draining. No signs of cellulitis. No fevers. Rx for Kelfex above will hopefully treat any bacteria. Hold off on I&D. Can consider clindagel if needed.   Preterm labor symptoms and general obstetric precautions including but not limited to vaginal bleeding, contractions, leaking of fluid and fetal movement were reviewed in detail with the patient. Please  refer to After Visit Summary for other counseling recommendations.  Return in about 2 weeks (around 07/20/2017) for ob visit.   Luiz Blare, DO [facility], [place] 07/06/2017, 11:29 AM

## 2017-07-06 NOTE — Telephone Encounter (Signed)
Called patient and notified her of her appointment on 07/11/17 at [facility] for NST/BPP and will see Dr. Ilda Basset at [phone]. Patient voiced understanding.

## 2017-07-09 LAB — CULTURE, OB URINE

## 2017-07-09 LAB — URINE CULTURE, OB REFLEX

## 2017-07-11 ENCOUNTER — Other Ambulatory Visit: Payer: Self-pay | Admitting: Obstetrics & Gynecology

## 2017-07-11 ENCOUNTER — Encounter: Payer: Self-pay | Admitting: Obstetrics and Gynecology

## 2017-07-11 ENCOUNTER — Other Ambulatory Visit: Payer: Self-pay

## 2017-07-11 NOTE — Progress Notes (Signed)
Patient did not keep OB appointment for 07/11/2017.  Durene Romans MD Attending Center for [employer])

## 2017-08-21 ENCOUNTER — Inpatient Hospital Stay (HOSPITAL_COMMUNITY): Payer: Medicaid Other

## 2017-08-21 ENCOUNTER — Encounter (HOSPITAL_COMMUNITY)
Admission: AD | Disposition: A | Discharge status: Home / Self Care | Payer: Self-pay | Source: Ambulatory Visit | Attending: Obstetrics & Gynecology

## 2017-08-21 ENCOUNTER — Encounter (HOSPITAL_COMMUNITY): Payer: Self-pay | Admitting: *Deleted

## 2017-08-21 ENCOUNTER — Inpatient Hospital Stay (HOSPITAL_COMMUNITY): Payer: Medicaid Other | Admitting: Anesthesiology

## 2017-08-21 ENCOUNTER — Inpatient Hospital Stay (EMERGENCY_DEPARTMENT_HOSPITAL)
Admission: AD | Admit: 2017-08-21 | Discharge: 2017-08-21 | Disposition: A | Discharge status: Home / Self Care | Payer: Medicaid Other | Source: Ambulatory Visit | Attending: Obstetrics and Gynecology | Admitting: Obstetrics and Gynecology

## 2017-08-21 ENCOUNTER — Inpatient Hospital Stay (HOSPITAL_COMMUNITY)
Admission: AD | Admit: 2017-08-21 | Discharge: 2017-08-23 | DRG: 767 | Disposition: A | Discharge status: Home / Self Care | Payer: Medicaid Other | Source: Ambulatory Visit | Attending: Obstetrics & Gynecology | Admitting: Obstetrics & Gynecology

## 2017-08-21 DIAGNOSIS — O24425 Gestational diabetes mellitus in childbirth, controlled by oral hypoglycemic drugs: Secondary | ICD-10-CM | POA: Diagnosis present

## 2017-08-21 DIAGNOSIS — O09529 Supervision of elderly multigravida, unspecified trimester: Secondary | ICD-10-CM

## 2017-08-21 DIAGNOSIS — O99334 Smoking (tobacco) complicating childbirth: Secondary | ICD-10-CM | POA: Diagnosis present

## 2017-08-21 DIAGNOSIS — M5430 Sciatica, unspecified side: Secondary | ICD-10-CM

## 2017-08-21 DIAGNOSIS — O471 False labor at or after 37 completed weeks of gestation: Secondary | ICD-10-CM

## 2017-08-21 DIAGNOSIS — Z3A38 38 weeks gestation of pregnancy: Secondary | ICD-10-CM

## 2017-08-21 DIAGNOSIS — F17210 Nicotine dependence, cigarettes, uncomplicated: Secondary | ICD-10-CM | POA: Diagnosis present

## 2017-08-21 DIAGNOSIS — O09523 Supervision of elderly multigravida, third trimester: Secondary | ICD-10-CM

## 2017-08-21 DIAGNOSIS — O24419 Gestational diabetes mellitus in pregnancy, unspecified control: Secondary | ICD-10-CM

## 2017-08-21 DIAGNOSIS — Z7982 Long term (current) use of aspirin: Secondary | ICD-10-CM

## 2017-08-21 DIAGNOSIS — O26893 Other specified pregnancy related conditions, third trimester: Secondary | ICD-10-CM | POA: Diagnosis present

## 2017-08-21 DIAGNOSIS — Z302 Encounter for sterilization: Secondary | ICD-10-CM

## 2017-08-21 DIAGNOSIS — O24429 Gestational diabetes mellitus in childbirth, unspecified control: Secondary | ICD-10-CM

## 2017-08-21 HISTORY — PX: TUBAL LIGATION: SHX77

## 2017-08-21 LAB — TYPE AND SCREEN
ABO/RH(D): O POS
ANTIBODY SCREEN: NEGATIVE

## 2017-08-21 LAB — RPR: RPR Ser Ql: NONREACTIVE

## 2017-08-21 LAB — CBC
HEMATOCRIT: 37.9 % (ref 36.0–46.0)
Hemoglobin: 13.3 g/dL (ref 12.0–15.0)
MCH: 31.7 pg (ref 26.0–34.0)
MCHC: 35.1 g/dL (ref 30.0–36.0)
MCV: 90.2 fL (ref 78.0–100.0)
PLATELETS: 219 10*3/uL (ref 150–400)
RBC: 4.2 MIL/uL (ref 3.87–5.11)
RDW: 14.6 % (ref 11.5–15.5)
WBC: 8.2 10*3/uL (ref 4.0–10.5)

## 2017-08-21 LAB — GLUCOSE, CAPILLARY: Glucose-Capillary: 91 mg/dL (ref 65–99)

## 2017-08-21 LAB — POCT FERN TEST: POCT FERN TEST: POSITIVE

## 2017-08-21 SURGERY — LIGATION, FALLOPIAN TUBE, POSTPARTUM
Anesthesia: Spinal | Site: Abdomen | Laterality: Left | Wound class: Clean

## 2017-08-21 MED ORDER — WITCH HAZEL-GLYCERIN EX PADS: 1.0000 "application " | MEDICATED_PAD | CUTANEOUS | Status: DC | PRN

## 2017-08-21 MED ORDER — SIMETHICONE 80 MG PO CHEW: 80.0000 mg | CHEWABLE_TABLET | ORAL | Status: DC | PRN

## 2017-08-21 MED ORDER — BUPIVACAINE HCL (PF) 0.5 % IJ SOLN
INTRAMUSCULAR | Status: AC
  Filled 2017-08-21: qty 30

## 2017-08-21 MED ORDER — FENTANYL CITRATE (PF) 100 MCG/2ML IJ SOLN
25.0000 ug | INTRAMUSCULAR | Status: DC | PRN
  Administered 2017-08-21: 50 ug via INTRAVENOUS

## 2017-08-21 MED ORDER — OXYTOCIN 40 UNITS IN LACTATED RINGERS INFUSION - SIMPLE MED
2.5000 [IU]/h | INTRAVENOUS | Status: DC
  Administered 2017-08-21: 2.5 [IU]/h via INTRAVENOUS

## 2017-08-21 MED ORDER — OXYCODONE HCL 5 MG/5ML PO SOLN: 5.0000 mg | Freq: Once | ORAL | Status: DC | PRN

## 2017-08-21 MED ORDER — LACTATED RINGERS IV SOLN
500.0000 mL | INTRAVENOUS | Status: DC | PRN
  Administered 2017-08-21: 125 mL/h via INTRAVENOUS
  Administered 2017-08-21: 1000 mL via INTRAVENOUS

## 2017-08-21 MED ORDER — OXYCODONE-ACETAMINOPHEN 5-325 MG PO TABS: 2.0000 | ORAL_TABLET | ORAL | Status: DC | PRN

## 2017-08-21 MED ORDER — ZOLPIDEM TARTRATE 5 MG PO TABS
5.0000 mg | ORAL_TABLET | Freq: Once | ORAL | Status: AC
  Administered 2017-08-21: 5 mg via ORAL
  Filled 2017-08-21: qty 1

## 2017-08-21 MED ORDER — ONDANSETRON HCL 4 MG PO TABS: 4.0000 mg | ORAL_TABLET | ORAL | Status: DC | PRN

## 2017-08-21 MED ORDER — DIPHENHYDRAMINE HCL 25 MG PO CAPS: 25.0000 mg | ORAL_CAPSULE | Freq: Four times a day (QID) | ORAL | Status: DC | PRN

## 2017-08-21 MED ORDER — FENTANYL CITRATE (PF) 100 MCG/2ML IJ SOLN
INTRAMUSCULAR | Status: AC
  Filled 2017-08-21: qty 2

## 2017-08-21 MED ORDER — OXYTOCIN 40 UNITS IN LACTATED RINGERS INFUSION - SIMPLE MED
INTRAVENOUS | Status: AC
  Administered 2017-08-21: 500 mL via INTRAVENOUS
  Filled 2017-08-21: qty 1000

## 2017-08-21 MED ORDER — BUPIVACAINE IN DEXTROSE 0.75-8.25 % IT SOLN
INTRATHECAL | Status: DC | PRN
  Administered 2017-08-21: 1.8 mg via INTRATHECAL

## 2017-08-21 MED ORDER — COCONUT OIL OIL: 1.0000 "application " | TOPICAL_OIL | Status: DC | PRN

## 2017-08-21 MED ORDER — DIBUCAINE 1 % RE OINT: 1.0000 "application " | TOPICAL_OINTMENT | RECTAL | Status: DC | PRN

## 2017-08-21 MED ORDER — PRENATAL MULTIVITAMIN CH
1.0000 | ORAL_TABLET | Freq: Every day | ORAL | Status: DC
  Administered 2017-08-21 – 2017-08-23 (×3): 1 via ORAL
  Filled 2017-08-21 (×3): qty 1

## 2017-08-21 MED ORDER — LACTATED RINGERS IV SOLN
INTRAVENOUS | Status: DC | PRN
  Administered 2017-08-21: 10:00:00 via INTRAVENOUS

## 2017-08-21 MED ORDER — MIDAZOLAM HCL 5 MG/5ML IJ SOLN
INTRAMUSCULAR | Status: DC | PRN
  Administered 2017-08-21 (×2): 1 mg via INTRAVENOUS

## 2017-08-21 MED ORDER — ONDANSETRON HCL 4 MG/2ML IJ SOLN
INTRAMUSCULAR | Status: DC | PRN
  Administered 2017-08-21: 4 mg via INTRAVENOUS

## 2017-08-21 MED ORDER — SOD CITRATE-CITRIC ACID 500-334 MG/5ML PO SOLN: 30.0000 mL | ORAL | Status: DC | PRN

## 2017-08-21 MED ORDER — SENNOSIDES-DOCUSATE SODIUM 8.6-50 MG PO TABS
2.0000 | ORAL_TABLET | ORAL | Status: DC
  Administered 2017-08-21: 2 via ORAL
  Filled 2017-08-21: qty 2

## 2017-08-21 MED ORDER — BUPIVACAINE IN DEXTROSE 0.75-8.25 % IT SOLN
INTRATHECAL | Status: AC
  Filled 2017-08-21: qty 2

## 2017-08-21 MED ORDER — OXYCODONE-ACETAMINOPHEN 5-325 MG PO TABS: 1.0000 | ORAL_TABLET | ORAL | Status: DC | PRN

## 2017-08-21 MED ORDER — FENTANYL CITRATE (PF) 100 MCG/2ML IJ SOLN
100.0000 ug | INTRAMUSCULAR | Status: DC | PRN
  Administered 2017-08-21: 100 ug via INTRAVENOUS
  Filled 2017-08-21: qty 2

## 2017-08-21 MED ORDER — OXYCODONE HCL 5 MG PO TABS: 5.0000 mg | ORAL_TABLET | Freq: Once | ORAL | Status: DC | PRN

## 2017-08-21 MED ORDER — ONDANSETRON HCL 4 MG/2ML IJ SOLN: 4.0000 mg | Freq: Four times a day (QID) | INTRAMUSCULAR | Status: DC | PRN

## 2017-08-21 MED ORDER — ACETAMINOPHEN 325 MG PO TABS
650.0000 mg | ORAL_TABLET | ORAL | Status: DC | PRN
  Administered 2017-08-21: 650 mg via ORAL
  Filled 2017-08-21: qty 2

## 2017-08-21 MED ORDER — FENTANYL CITRATE (PF) 100 MCG/2ML IJ SOLN: 100.0000 ug | Freq: Once | INTRAMUSCULAR | Status: DC

## 2017-08-21 MED ORDER — BUPIVACAINE HCL 0.5 % IJ SOLN
INTRAMUSCULAR | Status: DC | PRN
  Administered 2017-08-21: 20 mL

## 2017-08-21 MED ORDER — OXYCODONE HCL 5 MG PO TABS
5.0000 mg | ORAL_TABLET | Freq: Four times a day (QID) | ORAL | Status: DC | PRN
  Administered 2017-08-21: 5 mg via ORAL
  Filled 2017-08-21 (×2): qty 1

## 2017-08-21 MED ORDER — LACTATED RINGERS IV SOLN
INTRAVENOUS | Status: DC
  Administered 2017-08-21: 08:00:00 via INTRAVENOUS

## 2017-08-21 MED ORDER — ACETAMINOPHEN 325 MG PO TABS: 650.0000 mg | ORAL_TABLET | ORAL | Status: DC | PRN

## 2017-08-21 MED ORDER — FENTANYL CITRATE (PF) 100 MCG/2ML IJ SOLN
INTRAMUSCULAR | Status: DC | PRN
  Administered 2017-08-21 (×2): 50 ug via INTRAVENOUS

## 2017-08-21 MED ORDER — LIDOCAINE HCL (PF) 1 % IJ SOLN
INTRAMUSCULAR | Status: AC
  Filled 2017-08-21: qty 30

## 2017-08-21 MED ORDER — IBUPROFEN 600 MG PO TABS
600.0000 mg | ORAL_TABLET | Freq: Four times a day (QID) | ORAL | Status: DC
  Administered 2017-08-21 – 2017-08-23 (×8): 600 mg via ORAL
  Filled 2017-08-21 (×8): qty 1

## 2017-08-21 MED ORDER — ONDANSETRON HCL 4 MG/2ML IJ SOLN: 4.0000 mg | INTRAMUSCULAR | Status: DC | PRN

## 2017-08-21 MED ORDER — TETANUS-DIPHTH-ACELL PERTUSSIS 5-2.5-18.5 LF-MCG/0.5 IM SUSP: 0.5000 mL | Freq: Once | INTRAMUSCULAR | Status: DC

## 2017-08-21 MED ORDER — MIDAZOLAM HCL 2 MG/2ML IJ SOLN
INTRAMUSCULAR | Status: AC
  Filled 2017-08-21: qty 2

## 2017-08-21 MED ORDER — OXYTOCIN BOLUS FROM INFUSION
500.0000 mL | Freq: Once | INTRAVENOUS | Status: AC
Start: 2017-08-21 — End: 2017-08-21
  Administered 2017-08-21: 500 mL via INTRAVENOUS

## 2017-08-21 MED ORDER — ZOLPIDEM TARTRATE 5 MG PO TABS: 5.0000 mg | ORAL_TABLET | Freq: Every evening | ORAL | Status: DC | PRN

## 2017-08-21 MED ORDER — BENZOCAINE-MENTHOL 20-0.5 % EX AERO: 1.0000 "application " | INHALATION_SPRAY | CUTANEOUS | Status: DC | PRN

## 2017-08-21 MED ORDER — LIDOCAINE HCL (PF) 1 % IJ SOLN: 30.0000 mL | INTRAMUSCULAR | Status: DC | PRN

## 2017-08-21 MED ORDER — ONDANSETRON HCL 4 MG/2ML IJ SOLN
INTRAMUSCULAR | Status: AC
  Filled 2017-08-21: qty 2

## 2017-08-21 SURGICAL SUPPLY — 24 items
BENZOIN TINCTURE PRP APPL 2/3 (GAUZE/BANDAGES/DRESSINGS) ×3 IMPLANT
CLIP FILSHIE TUBAL LIGA STRL (Clip) ×3 IMPLANT
CLOSURE WOUND 1/2 X4 (GAUZE/BANDAGES/DRESSINGS) ×1
CLOSURE WOUND 1/4X4 (GAUZE/BANDAGES/DRESSINGS) ×1
CLOTH BEACON ORANGE TIMEOUT ST (SAFETY) ×3 IMPLANT
DRSG OPSITE POSTOP 3X4 (GAUZE/BANDAGES/DRESSINGS) ×3 IMPLANT
DURAPREP 26ML APPLICATOR (WOUND CARE) ×3 IMPLANT
GLOVE BIOGEL PI IND STRL 7.0 (GLOVE) ×3 IMPLANT
GLOVE BIOGEL PI INDICATOR 7.0 (GLOVE) ×6
GLOVE ECLIPSE 7.0 STRL STRAW (GLOVE) ×3 IMPLANT
GOWN STRL REUS W/TWL LRG LVL3 (GOWN DISPOSABLE) ×6 IMPLANT
GOWN STRL REUS W/TWL XL LVL3 (GOWN DISPOSABLE) ×3 IMPLANT
NEEDLE HYPO 22GX1.5 SAFETY (NEEDLE) ×3 IMPLANT
NS IRRIG 1000ML POUR BTL (IV SOLUTION) ×3 IMPLANT
PACK ABDOMINAL MINOR (CUSTOM PROCEDURE TRAY) ×3 IMPLANT
PROTECTOR NERVE ULNAR (MISCELLANEOUS) ×3 IMPLANT
STRIP CLOSURE SKIN 1/2X4 (GAUZE/BANDAGES/DRESSINGS) ×2 IMPLANT
STRIP CLOSURE SKIN 1/4X4 (GAUZE/BANDAGES/DRESSINGS) ×2 IMPLANT
SUT VIC AB 0 CT1 27 (SUTURE) ×2
SUT VIC AB 0 CT1 27XBRD ANBCTR (SUTURE) ×1 IMPLANT
SUT VIC AB 4-0 PS2 27 (SUTURE) ×3 IMPLANT
SYR CONTROL 10ML LL (SYRINGE) ×3 IMPLANT
TOWEL OR 17X24 6PK STRL BLUE (TOWEL DISPOSABLE) ×6 IMPLANT
TRAY FOLEY CATH 16FR SILVER (SET/KITS/TRAYS/PACK) ×3 IMPLANT

## 2017-08-21 NOTE — Anesthesia Preprocedure Evaluation (Signed)
Anesthesia Evaluation  Patient identified by MRN, date of birth, ID band Patient awake    Reviewed: Allergy & Precautions, H&P , NPO status , Patient's Chart, lab work & pertinent test results  Airway Mallampati: II   Neck ROM: full    Dental   Pulmonary Current Smoker,    breath sounds clear to auscultation       Cardiovascular negative cardio ROS   Rhythm:regular Rate:Normal     Neuro/Psych    GI/Hepatic   Endo/Other  diabetes, Gestationalobese  Renal/GU      Musculoskeletal   Abdominal   Peds  Hematology   Anesthesia Other Findings   Reproductive/Obstetrics S/p NSVD today                             Anesthesia Physical Anesthesia Plan  ASA: II  Anesthesia Plan: Spinal   Post-op Pain Management:    Induction: Intravenous  PONV Risk Score and Plan: 1 and Ondansetron, Dexamethasone, Midazolam and Treatment may vary due to age or medical condition  Airway Management Planned: Simple Face Mask  Additional Equipment:   Intra-op Plan:   Post-operative Plan:   Informed Consent: I have reviewed the patients History and Physical, chart, labs and discussed the procedure including the risks, benefits and alternatives for the proposed anesthesia with the patient or authorized representative who has indicated his/her understanding and acceptance.     Plan Discussed with: CRNA, Anesthesiologist and Surgeon  Anesthesia Plan Comments:         Anesthesia Quick Evaluation

## 2017-08-21 NOTE — Brief Op Note (Signed)
08/21/2017  10:24 AM  PATIENT:  Renee Schroeder  41 y.o. female  PRE-OPERATIVE DIAGNOSIS:  desires sterilization, history of right salpingectomy  POST-OPERATIVE DIAGNOSIS:  desires sterilization, history of right salpingectomy  PROCEDURE:  Procedure(s) with comments: LEFT POST PARTUM TUBAL LIGATION (Left) - History of Right Salpingectomy  SURGEON:  Surgeon(s) and Role:    * Renee Drafts, MD - Primary  ANESTHESIA:   spinal  EBL:  Total I/O In: 700 [I.V.:700] Out: 60 [Blood:60]  BLOOD ADMINISTERED:none  DRAINS: none   LOCAL MEDICATIONS USED:  MARCAINE     SPECIMEN:  No Specimen  DISPOSITION OF SPECIMEN:  N/A  COUNTS:  YES  TOURNIQUET:  * No tourniquets in log *  DICTATION: .Note written in EPIC  PLAN OF CARE: Pt has active admission order  PATIENT DISPOSITION:  PACU - hemodynamically stable.   Delay start of Pharmacological VTE agent (>24hrs) due to surgical blood loss or risk of bleeding: yes  Complications: none immediate  Renee Schroeder L. Renee Schroeder, M.D., Renee Schroeder

## 2017-08-21 NOTE — Lactation Note (Signed)
This note was copied from a baby's chart. Lactation Consultation Note  Patient Name: Renee Schroeder [facility] Date: 08/21/2017 Reason for consult: Initial assessment   P3, Baby 62 hours old and sleeping in visitors arms. Mother states she tried to breastfed her first two children with difficulty for a few weeks. She states this baby is latching better and he recently breastfed for 15 min. He received approx 20 ml of formula after breastfeeding. Reviewed family with LPI volume guidelines.  Encouraged mother to breastfeed before offering formula to help establish her milk supply. Recommend keeping feedings to 30 min. Mom encouraged to feed baby 8-12 times/24 hours and with feeding cues at least q 3 hours.  Mom made aware of O/P services, breastfeeding support groups, community resources, and our phone # for post-discharge questions.      Maternal Data Has patient been taught Hand Expression?: Yes Does the patient have breastfeeding experience prior to this delivery?: Yes  Feeding Feeding Type: Breast Fed Length of feed: 15 min  LATCH Score                   Interventions    Lactation Tools Discussed/Used     Consult Status Consult Status: Follow-up Date: 08/22/17 Follow-up type: In-patient    Vivianne Master [facility] 08/21/2017, 8:42 PM

## 2017-08-21 NOTE — MAU Note (Addendum)
Lost mucous plug this am. Some mucousy d/c. Ctxs all day since 12n. Last 2 ctxs closer and alittle different. Has not been to clinic in many wks. Having pain in left hip to knee cap and feels is sciatica

## 2017-08-21 NOTE — Plan of Care (Signed)
Problem: Activity: Goal: Ability to tolerate increased activity will improve Outcome: Progressing Pt able to lift hips off of the bed.  Will assist patient with ambulation to the bathroom.  Problem: Pain Management: Goal: General experience of comfort will improve and pain level will decrease Outcome: Not Progressing Pt reports pain of 6/10 that comes and goes and is worse with fundal checks.  Pt reports her lower abdomen feels sore.  Pt given tylenol 650mg  and ibuprofen 600mg  as ordered.  Dr. Burnett Kanaris notified and order placed for oxycodone IR 5mg  which cannot be given until 3hrs after last fentanyl dose (per orders), which will be at 1541.  Pt aware.  Will continue to monitor.   Problem: Urinary Elimination: Goal: Ability to reestablish a normal urinary elimination pattern will improve Outcome: Progressing Pt has good urine output in urinary catheter.  Urine yellow in color.  Will remove urinary catheter now that pt is able to feel her legs and lift her hips off of the bed.  Pt instructed that she will have to void within 6 hours of catheter removal.  Pt verbalized understanding.

## 2017-08-21 NOTE — MAU Provider Note (Signed)
Chief Complaint:  Contractions; Vaginal Discharge; and Leg Pain   First Provider Initiated Contact with Patient 08/21/17 0129     HPI: Renee Schroeder is a 41 y.o. [id number] at 3w0dwho presents to maternity admissions reporting loosing her mucus plug, sharp pain down left low back/hip/leg, irreg contractions--last two 34 minutes apart. [facility], non-compliant. Has not attended growth US's of Antenatal testing due to not having time. States she takes Metformin 500 mg PO BID and that blood sugars are "fine" Not checking fasting CBG's. After meals <120.   Associated signs and symptoms: Neg for LOF, VB. Very active baby.    Past Medical History:  Diagnosis Date  . Gestational diabetes   . Hydradenitis   . No pertinent past medical history    OB History  Gravida Para Term Preterm AB Living  '5 2 2 '$ 0 2 2  SAB TAB Ectopic Multiple Live Births  0 1 1 0 2    # Outcome Date GA Lbr Len/2nd Weight Sex Delivery Anes PTL Lv  5 Current           4 Ectopic 04/2010          3 TAB 09/2009          2 Term 03/07/08 456w0d5 lb 2 oz (2.325 kg) F Vag-Spont None  LIV  1 Term 09/22/98 4065w0d lb 14 oz (4.026 kg) M Vag-Spont EPI  LIV     Past Surgical History:  Procedure Laterality Date  . DILATION AND CURETTAGE OF UTERUS    . IRRIGATION AND DEBRIDEMENT ABSCESS N/A 03/14/2016   Procedure: IRRIGATION AND DEBRIDEMENT ABSCESS;  Surgeon: TodJackolyn ConferD;  Location: [facility];  Service: General;  Laterality: N/A;  . LAPAROSCOPY  05/26/2012   Procedure: LAPAROSCOPY OPERATIVE;  Surgeon: PegMora BellmanD;  [facility];  Service: Gynecology;  Laterality: N/A;  Operative Laparoscopy with Right Salpingectomy  . Sweat glands removed from rt. under arm Right    Family History  Problem Relation Age of Onset  . Other Neg Hx    Social History  Substance Use Topics  . Smoking status: Current Some Day Smoker    Packs/day: 0.50    Types: Cigarettes  . Smokeless tobacco: Never  Used  . Alcohol use No   No Known Allergies Prescriptions Prior to Admission  Medication Sig Dispense Refill Last Dose  . aspirin 81 MG chewable tablet Chew 1 tablet (81 mg total) by mouth daily. 90 tablet 3 08/20/2017 at Unknown time  . metFORMIN (GLUCOPHAGE) 500 MG tablet Take 2 tablets (1,000 mg total) by mouth 2 (two) times daily with a meal. 60 tablet 5 08/21/2017 at Unknown time  . Prenatal Multivit-Min-Fe-FA (PRENATAL VITAMINS) 0.8 MG tablet Take 1 tablet by mouth daily. 30 tablet 12 Past Month at Unknown time  . Lancets Misc. (ACCU-CHEK SOFTCLIX LANCET DEV) KIT 1 Units by Does not apply route as directed. 1 kit 0 Taking  . Prenatal Vit-Fe Fumarate-FA (PRENATAL MULTIVITAMIN) TABS tablet Take 1 tablet by mouth daily at 12 noon.   Taking    I have reviewed patient's Past Medical Hx, Surgical Hx, Family Hx, Social Hx, medications and allergies.   ROS:  Review of Systems  Constitutional: Negative for chills and fever.  Eyes: Negative for visual disturbance.  Gastrointestinal: Positive for abdominal pain.  Endocrine: Negative for polydipsia and polyuria.  Genitourinary: Positive for vaginal discharge (mucus only). Negative for vaginal bleeding.  Musculoskeletal: Positive  for back pain. Negative for gait problem.  Neurological: Negative for headaches.    Physical Exam  Patient Vitals for the past 24 hrs:  BP Temp Pulse Resp Height Weight  08/21/17 0030 135/77 97.8 F (36.6 C) 78 18 '5\' 8"'$  (1.727 m) 232 lb (105.2 kg)   Constitutional: Well-developed, well-nourished female in no acute distress.  Cardiovascular: normal rate Respiratory: normal effort GI: Abd soft, non-tender, gravid appropriate for gestational age. Pos BS x 4 MS: Extremities nontender, no edema, normal ROM Neurologic: Alert and oriented x 4.  GU: Neg CVAT.  Pelvic: NEFG, physiologic discharge, no blood, cervix clean. No CMT  Dilation: 2 Effacement (%): 80 Cervical Position: Posterior Station: -1 Presentation:  Vertex Exam by:: Blima Singer rNC   FHT:  Baseline 140 , moderate variability, accelerations present, few mild varibale decelerations Contractions: rare, mild-mod   Labs: Results for orders placed or performed during the hospital encounter of 08/21/17 (from the past 24 hour(s))  Glucose, capillary     Status: None   Collection Time: 08/21/17  1:36 AM  Result Value Ref Range   Glucose-Capillary 91 65 - 99 mg/dL   Comment 1 Notify RN    No cervical change during prolonged monitoring. BPP obtained 2/2 lack of antenatal testing--10/10. Scheduled for IOL at 39 weeks. No need to try to get pt in for prenatal visit at this point.   Discussed Hx, labs, exam w/ Dr. Elly Modena. Agrees w/ POC. New orders: None.   Imaging:  No results found.  MAU Course: Orders Placed This Encounter  Procedures  . Culture, beta strep (group b only)  . [place] MFM Fetal BPP Wo Non Stress  . [place] MFM OB FOLLOW UP  . Glucose, capillary  . Discharge patient   Meds ordered this encounter  Medications  . DISCONTD: fentaNYL (SUBLIMAZE) injection 100 mcg  . zolpidem (AMBIEN) tablet 5 mg    MDM:  Assessment: 1. False labor after 37 completed weeks of gestation   2. GDM, class A2   3. Elderly multigravida in third trimester   4. Sciatica, unspecified laterality     Plan: Discharge home in stable condition.  Labor precautions and fetal kick counts Follow-up [facility] Follow up.   Specialty:  Radiology Why:  will call to schedule growth ultrasound Contact information: [address] [phone] [facility] [place] Follow up on 08/28/2017.   Contact information: [address] [phone] [place] [phone]          Allergies as of 08/21/2017   No Known Allergies     Medication List    STOP taking these medications    prenatal multivitamin Tabs tablet     TAKE these medications   ACCU-CHEK SOFTCLIX LANCET DEV Kit 1 Units by Does not apply route as directed.   aspirin 81 MG chewable tablet Chew 1 tablet (81 mg total) by mouth daily.   metFORMIN 500 MG tablet Commonly known as:  GLUCOPHAGE Take 2 tablets (1,000 mg total) by mouth 2 (two) times daily with a meal.   Prenatal Vitamins 0.8 MG tablet Take 1 tablet by mouth daily.            Discharge Care Instructions        Start     Ordered   08/21/17 0000  Discharge patient    Question Answer Comment  Discharge disposition 01-Home or Self Care   Discharge patient date 08/21/2017      08/21/17 0336   08/21/17 0000  [place] MFM OB FOLLOW UP    Question Answer Comment  Reason for Exam (SYMPTOM  OR DIAGNOSIS REQUIRED) Incontrolled A2 GDM, AMA, limited prenatal care, 38 weeks   Preferred imaging location? [facility]      08/21/17 [place], [place], [place] 08/21/2017 4:23 AM

## 2017-08-21 NOTE — Transfer of Care (Signed)
Immediate Anesthesia Transfer of Care Note  Patient: Renee Schroeder  Procedure(s) Performed: Procedure(s) with comments: LEFT POST PARTUM TUBAL LIGATION (Left) - History of Right Salpingectomy  Patient Location: PACU  Anesthesia Type:MAC and Spinal  Level of Consciousness: awake, alert  and oriented  Airway & Oxygen Therapy: Patient Spontanous Breathing  Post-op Assessment: Report given to RN and Post -op Vital signs reviewed and stable  Post vital signs: Reviewed and stable  Last Vitals:  Vitals:   08/21/17 0847 08/21/17 0901  BP: 110/67 120/67  Pulse: 68 60  Resp: 16 16  Temp:      Last Pain:  Vitals:   08/21/17 0758  TempSrc: Oral  PainSc:          Complications: No apparent anesthesia complications

## 2017-08-21 NOTE — Anesthesia Procedure Notes (Signed)
Spinal  Patient location during procedure: OR Start time: 08/21/2017 9:50 AM End time: 08/21/2017 9:55 AM Staffing Anesthesiologist: Marcie Bal, Ioannis Schuh Performed: anesthesiologist  Preanesthetic Checklist Completed: patient identified, surgical consent, pre-op evaluation, timeout performed, IV checked, risks and benefits discussed and monitors and equipment checked Spinal Block Patient position: sitting Prep: DuraPrep Patient monitoring: cardiac monitor, continuous pulse ox and blood pressure Approach: midline Location: L3-4 Injection technique: single-shot Needle Needle type: Pencan  Needle gauge: 24 G Needle length: 9 cm Assessment Sensory level: T10 Additional Notes Functioning IV was confirmed and monitors were applied. Sterile prep and drape, including hand hygiene and sterile gloves were used. The patient was positioned and the spine was prepped. The skin was anesthetized with lidocaine.  Free flow of clear CSF was obtained prior to injecting local anesthetic into the CSF.  The spinal needle aspirated freely following injection.  The needle was carefully withdrawn.  The patient tolerated the procedure well.

## 2017-08-21 NOTE — Progress Notes (Signed)
Dr Gita Kudo notified of pt;s admission and status. Aware of limited pnc, GD on glyburide but has not taken as directed, sve, ctx pattern. Will admit to [facility]

## 2017-08-21 NOTE — Progress Notes (Signed)
Baby active 

## 2017-08-21 NOTE — Op Note (Signed)
08/21/2017  10:24 AM  PATIENT:  Renee Schroeder  41 y.o. female  PRE-OPERATIVE DIAGNOSIS:  desires sterilization, history of right salpingectomy  POST-OPERATIVE DIAGNOSIS:  desires sterilization, history of right salpingectomy  PROCEDURE:  Procedure(s) with comments: LEFT POST PARTUM TUBAL LIGATION (Left) - History of Right Salpingectomy  SURGEON:  Surgeon(s) and Role:    * Lavonia Drafts, MD - Primary  ANESTHESIA:   spinal  EBL:  Total I/O In: 700 [I.V.:700] Out: 60 [Blood:60]  BLOOD ADMINISTERED:none  DRAINS: none   LOCAL MEDICATIONS USED:  MARCAINE     SPECIMEN:  No Specimen  DISPOSITION OF SPECIMEN:  N/A  COUNTS:  YES  TOURNIQUET:  * No tourniquets in log *  DICTATION: .Note written in EPIC  PLAN OF CARE: Pt has active admission order  PATIENT DISPOSITION:  PACU - hemodynamically stable.   Delay start of Pharmacological VTE agent (>24hrs) due to surgical blood loss or risk of bleeding: yes  Complications: none immediate  INDICATIONS: 41 y.o. [id number]  with undesired fertility,status post vaginal delivery, desires permanent sterilization.  Pt with ah h/o a right salpingectomy after an ectopic pregnancy.  Other reversible forms of contraception were discussed with patient; she declines all other modalities. Risks of procedure discussed with patient including but not limited to: risk of regret, permanence of method, bleeding, infection, injury to surrounding organs and need for additional procedures.  Failure risk of 0.5-1% with increased risk of ectopic gestation if pregnancy occurs was also discussed with patient.     FINDINGS:  Normal uterus, left tube and ovaries.  Right fallopian tube surgically absent.   PROCEDURE DETAILS: The patient was taken to the operating room where her epidural anesthesia was dosed up to surgical level and found to be adequate.  She was then placed in the dorsal supine position and prepped and draped in sterile fashion.  After an  adequate timeout was performed, attention was turned to the patient's abdomen where a small transverse skin incision was made under the umbilical fold. The incision was taken down to the layer of fascia using the scalpel, and fascia was incised, and extended bilaterally using Mayo scissors. The peritoneum was entered in a sharp fashion. Attention was then turned to the patient's uterus, and left fallopian tube was identified and followed out to the fimbriated end.  A Filshie clip was placed on the left fallopian tube about 3 cm from the cornual attachment, with care given to incorporate the underlying mesosalpinx. Good hemostasis was noted overall.  The instruments were then removed from the patient's abdomen and the fascial incision was repaired with 0 Vicryl, and the skin was closed with a 4-0 Vicryl subcuticular stitch. The patient tolerated the procedure well.  Instrument, sponge, and needle counts were correct times two.  The patient was then taken to the recovery room awake and in stable condition.  Tynesia Harral L. Harraway-Smith, M.D., Cherlynn June

## 2017-08-21 NOTE — H&P (Signed)
OBSTETRIC ADMISSION HISTORY AND PHYSICAL  Renee Schroeder is a 41 y.o. female [phone] with IUP at 45w0dby first trimester U/S presenting for SOL. She reports +FMs, No LOF, no VB, no blurry vision, headaches or peripheral edema, and no RUQ pain.  This pregnancy is complicated by limited prenatal care, AMA, uncontrolled GDMA2 and UTI in pregnancy.  She plans on breast feeding. She requests BTL for birth control. [facility]  Dating: By 1st trimester U/S --->  Estimated Date of Delivery: 09/04/17  Sono:    '@[redacted]w[redacted]d'$ , CWD, normal anatomy, cephalic presentation, 19381O 49% EFW   Prenatal History/Complications:  [facility]inic [facility] Prenatal Labs  Dating Early u/s Blood type: O/Positive/-- (04/19 0000)   Genetic Screen 1 Screen:    AFP:     Quad: Nml   NIPS: Antibody:Negative (04/19 0000)  Anatomic [place]NL Rubella: Immune (04/19 0000)  GTT Early:               Third trimester:  RPR: Nonreactive (04/19 0000)   Flu vaccine declines HBsAg: Negative (04/19 0000)   TDaP vaccine  06/15/17                                    Rhogam: NA HIV: Non-reactive (04/19 0000)   Baby Food  breast                                             GBS: (For PCN allergy, check sensitivities)  Contraception BTL consent 06/15/17 Pap:  Circumcision Yes if a boy   Pediatrician [facility](friend)    Past Medical History: Past Medical History:  Diagnosis Date  . Gestational diabetes   . Hydradenitis   . No pertinent past medical history     Past Surgical History: Past Surgical History:  Procedure Laterality Date  . DILATION AND CURETTAGE OF UTERUS    . IRRIGATION AND DEBRIDEMENT ABSCESS N/A 03/14/2016   Procedure: IRRIGATION AND DEBRIDEMENT ABSCESS;  Surgeon: TJackolyn Confer MD;  Location[facility];  Service: General;  Laterality: N/A;  . LAPAROSCOPY  05/26/2012   Procedure: LAPAROSCOPY OPERATIVE;  Surgeon: PMora Bellman MD;  [facility];  Service:  Gynecology;  Laterality: N/A;  Operative Laparoscopy with Right Salpingectomy  . Sweat glands removed from rt. under arm Right     Obstetrical History: OB History    Gravida Para Term Preterm AB Living   '5 2 2 '$ 0 2 2   SAB TAB Ectopic Multiple Live Births   0 1 1 0 2      Social History: Social History   Social History  . Marital status: Single    Spouse name: N/A  . Number of children: N/A  . Years of education: N/A   Social History Main Topics  . Smoking status: Current Some Day Smoker    Packs/day: 0.50    Types: Cigarettes  . Smokeless tobacco: Never Used  . Alcohol use No  . Drug use: No  . Sexual activity: Not Currently    Birth control/ protection: None   Other Topics Concern  . None   Social History Narrative  . None    Family History: Family History  Problem Relation Age of Onset  . Other  Neg Hx     Allergies: No Known Allergies  Prescriptions Prior to Admission  Medication Sig Dispense Refill Last Dose  . aspirin 81 MG chewable tablet Chew 1 tablet (81 mg total) by mouth daily. 90 tablet 3 08/20/2017 at Unknown time  . Lancets Misc. (ACCU-CHEK SOFTCLIX LANCET DEV) KIT 1 Units by Does not apply route as directed. 1 kit 0 Taking  . metFORMIN (GLUCOPHAGE) 500 MG tablet Take 2 tablets (1,000 mg total) by mouth 2 (two) times daily with a meal. 60 tablet 5 08/21/2017 at Unknown time  . Prenatal Multivit-Min-Fe-FA (PRENATAL VITAMINS) 0.8 MG tablet Take 1 tablet by mouth daily. 30 tablet 12 Past Month at Unknown time     Review of Systems   All systems reviewed and negative except as stated in HPI  Blood pressure 133/76, pulse 63, temperature 97.7 F (36.5 C), resp. rate 20, last menstrual period 11/17/2016. General appearance: alert and cooperative Lungs: clear to auscultation bilaterally Heart: regular rate and rhythm Abdomen: soft, non-tender; bowel sounds normal Pelvic: deferred Extremities: Homans sign is negative, no sign of DVT Presentation:  cephalic Fetal monitoringBaseline: 125 bpm, Variability: Good {> 6 bpm), Accelerations: Reactive and Decelerations: Absent Uterine activity: q2-4 min Dilation: 4 Effacement (%): 80 Station: -2 Exam by:: Blima Singer RNC   Prenatal labs: ABO, Rh: O/Positive/-- (04/19 0000) Antibody: Negative (04/19 0000) Rubella: Immune (04/19 0000) RPR: Non Reactive (07/11 1041)  HBsAg: Negative (04/19 0000)  HIV: Non-reactive (04/19 0000)  GBS:   unknown A1C: 5.8 Genetic screening: Quad WNL Anatomy US: WNL  Prenatal Transfer Tool  Maternal Diabetes: Yes:  Diabetes Type:  Insulin/Medication controlled Genetic Screening: Normal Maternal Ultrasounds/Referrals: Normal Fetal Ultrasounds or other Referrals:  None Maternal Substance Abuse:  No Significant Maternal Medications:  Metformin, noncompliant Significant Maternal Lab Results: None  Results for orders placed or performed during the hospital encounter of 08/21/17 (from the past 24 hour(s))  Glucose, capillary   Collection Time: 08/21/17  1:36 AM  Result Value Ref Range   Glucose-Capillary 91 65 - 99 mg/dL   Comment 1 Notify RN     Patient Active Problem List   Diagnosis Date Noted  . History of hidradenitis suppurativa 07/06/2017  . Urinary tract infection in mother during second trimester of pregnancy 04/28/2017  . Supervision of high risk pregnancy, antepartum 04/11/2017  . GDM, class A2 04/11/2017  . AMA (advanced maternal age) multigravida 35+ 04/11/2017    Assessment/Plan:  Renee Schroeder is a 41 y.o. [id number] at 66w0dhere for SOL.   #Labor: Expectant management for now, augment later if needed. Active labor.  #GDMA2: CBG q4hr. CBG on admission 91. #Pain: IV Fentanyl 1037m q1hr PRN, Epidural when desired #FWB: Category 1 #ID:  GBS unknown, PCR pending #MOF: Breast #MOC: BTL - consent signed #Circ:  Desired, outpatient  MaMetta ClinesDO PGY-2 Family Medicine Resident 08/21/2017, 6:23 AM   OB FELLOW HISTORY AND PHYSICAL  ATTESTATION  I confirm that I have verified the information documented in the resident's note and that I have also personally reperformed the physical exam and all medical decision making activities. I agree with above documentation and have made edits as needed.   JaLuiz BlareB Fellow

## 2017-08-21 NOTE — MAU Note (Signed)
Pt d/c earlier but waited in lobby. More uncomfortable with ctxs

## 2017-08-21 NOTE — MAU Note (Signed)
IOL scheduled for Sat at 12MN. Pt to be at hospital about 11:50pm.

## 2017-08-22 ENCOUNTER — Encounter (HOSPITAL_COMMUNITY): Payer: Self-pay

## 2017-08-22 LAB — GLUCOSE, CAPILLARY
GLUCOSE-CAPILLARY: 85 mg/dL (ref 65–99)
Glucose-Capillary: 122 mg/dL — ABNORMAL HIGH (ref 65–99)

## 2017-08-22 NOTE — Lactation Note (Signed)
This note was copied from a baby's chart. Lactation Consultation Note  Patient Name: Renee Schroeder [id number] Date: 08/22/2017    Baby 33 hours old. Baby having a bottle of formula and reports that she has not put baby to breast today. Offered to assist with latching, but mom declined stating that she has "has it covered." Enc mom to call for assistance as needed.   Maternal Data    Feeding    LATCH Score                   Interventions    Lactation Tools Discussed/Used     Consult Status      Andres Labrum 08/22/2017, 12:41 PM

## 2017-08-22 NOTE — Progress Notes (Signed)
Post Partum Day 1 Subjective: PPD #1 and POD #1 from SVD without complication and BTL.  Doing well.  Pain controlled.  Minimal lochia.  Tolerating PO.  Ambulating with ease.  No concerns.  Objective: Blood pressure 108/74, pulse 66, temperature 98.3 F (36.8 C), temperature source Oral, resp. rate 18, height 5\' 8"  (1.727 m), weight 105.2 kg (232 lb), last menstrual period 11/17/2016, SpO2 100 %, unknown if currently breastfeeding.  Physical Exam:  General: alert and cooperative Lochia: appropriate Uterine Fundus: firm Incision: no significant drainage, no significant erythema DVT Evaluation: No evidence of DVT seen on physical exam.   Recent Labs  08/21/17 0642  HGB 13.3  HCT 37.9    Assessment/Plan: PPD #1 and POD#1 from SVD and BTL, doing well.   Continue routine postpartum care.  GDMA2 - fasting CBG pending this AM. Will require GTT at PP follow up. Breast and bottle feeding. GBS unk status - likely monitor infant x48 hours   Anticipate discharge tomorrow.  Could be discharged today if infant cleared by pediatrician.    LOS: 1 day   Metta Clines 08/22/2017, 7:37 AM

## 2017-08-22 NOTE — Progress Notes (Signed)
UR chart review completed.  

## 2017-08-23 ENCOUNTER — Encounter (HOSPITAL_COMMUNITY): Payer: Self-pay | Admitting: Obstetrics & Gynecology

## 2017-08-23 LAB — CULTURE, BETA STREP (GROUP B ONLY)

## 2017-08-23 MED ORDER — IBUPROFEN 600 MG PO TABS: 600.0000 mg | ORAL_TABLET | Freq: Four times a day (QID) | ORAL | 0 refills | Status: DC | PRN

## 2017-08-23 NOTE — Progress Notes (Signed)
CSW spoke with N. Finch/[facility] and inquired as to whether or not MOB has been involved with the [facility] program during pregnancy.  She states she has not.  CSW asked that MOB be referred to [facility] program for additional support given hx of postpartum depression.

## 2017-08-23 NOTE — Lactation Note (Signed)
This note was copied from a baby's chart. Lactation Consultation Note  Patient Name: Renee Schroeder [id number] Date: 08/23/2017   Baby 73 hours old. Mom reports that she has no need for [facility] assistance at this time. Mom enc to call as needed.   Maternal Data    Feeding Feeding Type: Bottle Fed - Formula  LATCH Score                   Interventions    Lactation Tools Discussed/Used     Consult Status      Andres Labrum 08/23/2017, 9:35 AM

## 2017-08-23 NOTE — Progress Notes (Signed)
CSW received consult due to score 9 on Edinburgh Depression Screen.   CSW met with MOB to offer support and education.  MOB was pleasant and welcoming of CSW's visit and seemed appreciative of CSW's concern for her emotional wellbeing.  MOB states she is feeling "much better now," and explained that she had just lost her job when she found out she was pregnant.  She states she was very emotional and stressed due to this and since she already has two children at home, one who was finishing high school.  She reports that she hid the pregnancy from her family and friends in the beginning and thought about making an adoption plan.  She states when her family realized she was pregnant, they gathered around her in support.  She states her mother has been paying her rent in order to ensure that she does not lose her housing.  She states she asked her cousin to mail documents (for adoption) for her, but never heard from the adoption agency.  MOB explained that when she asked her cousin about the package she asked her to mail, her cousin told her that she "googled" the address and found out that the papers were for an adoption and never mailed it.  MOB states gratitude and thanks that her cousin didn't mail the paperwork.  She became tearful when she spoke about this and states she is so glad she did not go through with an adoption plan because now she could not imagine life without her newborn son.  She reports that she has a great support system, but admits to having trouble allowing them to offer help to her.  She reports that her family and friends provided her with a baby shower and that she has everything she needs for baby at home except a bed.  CSW informed her of the availability of a Baby Box from the hospital.  MOB states interest and thanks.  CSW gave her website to complete online SIDS education and quiz in order to receive code.  MOB will notify staff when she completes quiz and obtains her code.   MOB  reports that she experienced PMADs after her second child.  She states she had her mother's constant help with her first child, but felt, "on my own" with her second child.  MOB reports that she didn't want to be around anyone when she got home with her baby (9 year old) and asked her mother to come get her children so she could be alone.  MOB states her mother came to get her kids for about 2 weeks.  She states she slept a lot and then came out of her depression, cleaning the house and opening the windows.  She states she missed her children and called her mother to bring them back.  She notes no additional symptoms after this two week period of needing to be alone.  She states her mother stayed with her for a few days for additional support when she came back with her children.  CSW asked MOB to make a plan when she goes home with this baby for additional support given her experience after her last baby's birth.  MOB states her aunt and mother are very aware of her hx and plan to be very involved.  She reports that they live close by and have already stated that they plan to check on MOB frequently.  CSW asked MOB to call them if she needs support and MOB agrees that   she will.  She also states that her 18 year old son is still in the home and is supportive.  FOB is involved and supportive, though they are not in a relationship.  She feels she can contact him for support as well.  She states he plans to get baby a crib in the near future.  She reports that they have a good relationship although they are not "together."   CSW provided education regarding Baby Blues vs PMADs and provided MOB with information about support groups held at [facility], as well as mental health follow up at [facility].  CSW encouraged MOB to evaluate her mental health throughout the postpartum period with the use of the New Mom Checklist developed by [facility] as well as the  Edinburgh Postpartum Depression Screen and notify a medical professional if symptoms arise.  MOB states she has an OBCM/Cynthia through [facility] whom she feels very comfortable talking with and will call if needed.  She was receptive to resources given and thanked CSW. CSW identifies no further interventions needed and no barriers to discharge when medically ready and once MOB completes her SIDS education to receive Baby Box.  

## 2017-08-23 NOTE — Lactation Note (Signed)
This note was copied from a baby's chart. Lactation Consultation Note  Patient Name: Renee Schroeder [id number] Date: 08/23/2017 Reason for consult: Follow-up assessment;Mother's request   Mom requested manual pump after talking with [facility]. Mom requested manual pump. Manual pump was given, mom declined needing to be shown how to use and says she has used one like it before. Mom reports no questions/concerns at this time.    Maternal Data    Feeding    LATCH Score                   Interventions    Lactation Tools Discussed/Used     Consult Status Consult Status: Complete Follow-up type: Call as needed    Donn Pierini 08/23/2017, 2:05 PM

## 2017-08-23 NOTE — Discharge Summary (Signed)
OB Discharge Summary     Patient Name: Renee Schroeder DOB: 08-31-1976 MRN: [id number]  Date of admission: 08/21/2017 Delivering MD: Katheren Shams   Date of discharge: 08/23/2017  Admitting diagnosis: 39 WK CTX Intrauterine pregnancy: [redacted]w[redacted]d    Secondary diagnosis:  Principal Problem:   Normal labor Active Problems:   GDM, class A2   AMA (advanced maternal age) multigravida 35+   Encounter for sterilization  Additional problems:  Patient Active Problem List   Diagnosis Date Noted  . Normal labor 08/21/2017  . Encounter for sterilization 08/21/2017  . History of hidradenitis suppurativa 07/06/2017  . Urinary tract infection in mother during second trimester of pregnancy 04/28/2017  . Supervision of high risk pregnancy, antepartum 04/11/2017  . GDM, class A2 04/11/2017  . AMA (advanced maternal age) multigravida 35+ 04/11/2017        Discharge diagnosis: Term Pregnancy Delivered                                                                                                Post partum procedures: Bilateral Tubal Ligation  Augmentation: N/A  Complications: None  Hospital course:  Onset of Labor With Vaginal Delivery     41y.o. yo [id number] admitted in Active Labor on 08/21/2017. Patient had an uncomplicated labor course as follows:  Membrane Rupture Time/Date: 7:45 AM ,08/21/2017   Intrapartum Procedures: Episiotomy: None [1]                                         Lacerations:  None [1]  Patient had a delivery of a Viable infant. 08/21/2017  Information for the patient's newborn:  PoCyleigh, Massaro0[[id number]]Delivery Method: Vaginal, Spontaneous Delivery (Filed from Delivery Summary)    Pateint had an uncomplicated postpartum course.  She is ambulating, tolerating a regular diet, passing flatus, and urinating well. Patient is discharged home in stable condition on 08/23/17.   Physical exam  Vitals:   08/22/17 0509 08/22/17 0844 08/22/17 1808  08/23/17 0515  BP: 108/74 112/74 120/69 (!) 114/59  Pulse: 66 62 73 62  Resp: _0 Temp: 98.3 F (36.8 C) 98.7 F (37.1 C) 98.3 F (36.8 C) 97.8 F (36.6 C)  TempSrc: Oral Oral Oral Oral  SpO2:  100%    Weight:      Height:       General: alert, cooperative and no distress Lochia: appropriate Uterine Fundus: firm Incision: N/A DVT Evaluation: No evidence of DVT seen on physical exam. Labs: Lab Results  Component Value Date   WBC 8.2 08/21/2017   HGB 13.3 08/21/2017   HCT 37.9 08/21/2017   MCV 90.2 08/21/2017   PLT 219 08/21/2017   CMP Latest Ref Rng & Units 04/11/2017  Glucose 65 - 99 mg/dL 94  BUN 6 - 24 mg/dL 6  Creatinine 0.57 - 1.00 mg/dL 0.62  Sodium 134 - 144 mmol/L 137  Potassium 3.5 - 5.2 mmol/L 4.4  Chloride 96 -  106 mmol/L 106  CO2 18 - 29 mmol/L 20  Calcium 8.7 - 10.2 mg/dL 9.6  Total Protein 6.0 - 8.5 g/dL 6.6  Total Bilirubin 0.0 - 1.2 mg/dL <0.2  Alkaline Phos 39 - 117 IU/L 77  AST 0 - 40 IU/L 19  ALT 0 - 32 IU/L 30    Discharge instruction: per After Visit Summary and "Baby and Me Booklet".  After visit meds:  Allergies as of 08/23/2017   No Known Allergies     Medication List    STOP taking these medications   ACCU-CHEK SOFTCLIX LANCET DEV Kit   aspirin 81 MG chewable tablet   metFORMIN 500 MG tablet Commonly known as:  GLUCOPHAGE     TAKE these medications   acetaminophen 500 MG tablet Commonly known as:  TYLENOL Take 500 mg by mouth every 6 (six) hours as needed for mild pain or headache.   ibuprofen 600 MG tablet Commonly known as:  ADVIL,MOTRIN Take 1 tablet (600 mg total) by mouth every 6 (six) hours as needed for moderate pain or cramping.   Prenatal Vitamins 0.8 MG tablet Take 1 tablet by mouth daily.            Discharge Care Instructions        Start     Ordered   08/23/17 0000  ibuprofen (ADVIL,MOTRIN) 600 MG tablet  Every 6 hours PRN     08/23/17 0850      Diet: routine diet  Activity: Advance  as tolerated. Pelvic rest for 6 weeks.   Outpatient follow up:6 weeks Follow up Appt:No future appointments. Follow up Visit:No Follow-up on file.  Postpartum contraception: Tubal Ligation  Newborn Data: Live born female  Birth Weight: 5 lb 9.6 oz (2540 g) APGAR: 8, 9  Baby Feeding: Breast Disposition:home with mother   08/23/2017 Metta Clines, DO PGY-2 Family Medicine Resident   Midwife attestation I have seen and examined this patient and agree with above documentation in the resident's note.   Renee Schroeder is a 41 y.o. [id number] s/p SVD and BTL.   Pain is well controlled.  Plan for birth control is bilateral tubal ligation.  Method of Feeding: breast  PE:  Gen: well appearing Heart: reg rate Lungs: normal WOB Fundus firm Ext: soft, no pain, no edema   Recent Labs  08/21/17 0642  HGB 13.3  HCT 37.9     Assessment - discharge today  Plan: - postpartum care discussed - f/u clinic in 6 weeks for postpartum visit - GTT at 6-12 weeks pp   Julianne Handler, CNM 8:55 AM

## 2017-08-23 NOTE — Discharge Instructions (Signed)

## 2017-08-23 NOTE — Anesthesia Postprocedure Evaluation (Signed)
Anesthesia Post Note  Patient: Renee Schroeder  Procedure(s) Performed: Procedure(s) (LRB): LEFT POST PARTUM TUBAL LIGATION (Left)     Patient location during evaluation: PACU Anesthesia Type: Spinal Level of consciousness: oriented and awake and alert Pain management: pain level controlled Vital Signs Assessment: post-procedure vital signs reviewed and stable Respiratory status: spontaneous breathing, respiratory function stable and patient connected to nasal cannula oxygen Cardiovascular status: blood pressure returned to baseline and stable Postop Assessment: no headache, no backache and no apparent nausea or vomiting Anesthetic complications: no    Last Vitals:  Vitals:   08/22/17 1808 08/23/17 0515  BP: 120/69 (!) 114/59  Pulse: 73 62  Resp: 18 18  Temp: 36.8 C 36.6 C  SpO2:      Last Pain:  Vitals:   08/23/17 1120  TempSrc:   PainSc: 0-No pain                 [name] S

## 2017-08-25 ENCOUNTER — Telehealth: Payer: Self-pay | Admitting: *Deleted

## 2017-08-25 NOTE — Telephone Encounter (Signed)
Received a voice message from this am from Skelp a nurse with [facility]. States she is on a home viist and [name] is having pain in her left hip =9.5 and is taking tylenol extra strength without relief. Would like [place] to call her and let her know what she can take or if she needs to come in .

## 2017-08-26 NOTE — Telephone Encounter (Signed)
I called Renee Schroeder and left a message I am calling Renee Schroeder based on a phone call from visiting nurse- please call our office.

## 2017-08-28 ENCOUNTER — Encounter (HOSPITAL_COMMUNITY): Payer: Self-pay

## 2017-08-29 ENCOUNTER — Encounter: Payer: Self-pay | Admitting: General Practice

## 2017-09-01 NOTE — Telephone Encounter (Signed)
I called Renee Schroeder and was unable to leave a message- heard a message call can not be completed at this time.

## 2017-09-05 NOTE — Telephone Encounter (Signed)
Attempted to call patient. Voice mail left stating I am calling to check on her and see if the problem she was having is better and/or how we can help her. Asked her to return my call at the clinic.

## 2017-09-06 ENCOUNTER — Telehealth: Payer: Self-pay | Admitting: *Deleted

## 2017-09-06 NOTE — Telephone Encounter (Signed)
Patient left message on nurse voicemail today at 1212.  Patient states she delivered on 08/21/17 and is having left leg and hip pain.  Returned patient's call.  Patient states the pain is better but is still bothering her.  States she had a vaginal delivery and a postpartum tubal.  Encouraged patient to come in for evaluation.  Moved appointment from 09/29/17 to 09/07/17.  Patient states understanding.

## 2017-09-07 ENCOUNTER — Ambulatory Visit (HOSPITAL_COMMUNITY)
Admission: RE | Admit: 2017-09-07 | Discharge: 2017-09-07 | Disposition: A | Discharge status: Home / Self Care | Payer: Medicaid Other | Source: Ambulatory Visit | Attending: Family Medicine | Admitting: Family Medicine

## 2017-09-07 ENCOUNTER — Ambulatory Visit (INDEPENDENT_AMBULATORY_CARE_PROVIDER_SITE_OTHER): Payer: Medicaid Other | Admitting: Family Medicine

## 2017-09-07 VITALS — BP 108/88 | HR 75 | Ht 69.0 in | Wt 212.9 lb

## 2017-09-07 DIAGNOSIS — M25552 Pain in left hip: Secondary | ICD-10-CM | POA: Diagnosis present

## 2017-09-07 DIAGNOSIS — L723 Sebaceous cyst: Secondary | ICD-10-CM

## 2017-09-07 MED ORDER — MELOXICAM 15 MG PO TABS: 15.0000 mg | ORAL_TABLET | Freq: Every day | ORAL | 0 refills | Status: DC

## 2017-09-07 NOTE — Patient Instructions (Addendum)

## 2017-09-07 NOTE — Progress Notes (Signed)
Here today for c/o left pain , delivered 08/21/17.

## 2017-09-07 NOTE — Progress Notes (Signed)
    Subjective:    Patient ID: Renee Schroeder is a 41 y.o. female presenting with left hip pain  on 09/07/2017  HPI: Reports left leg is dragging. Worse since giving birth. 6 wks. Feels like it is shorter than the other. Worse pain with rising. Pain is pushing sensation and worse with stepping down. Feels like leg lifts help.  Walking with limp. Denies numbness Also with lump on neck.  Review of Systems  Constitutional: Negative for chills and fever.  Respiratory: Negative for shortness of breath.   Cardiovascular: Negative for chest pain.  Gastrointestinal: Negative for abdominal pain, nausea and vomiting.  Genitourinary: Negative for dysuria.  Musculoskeletal: Positive for arthralgias.  Skin: Negative for rash.      Objective:    BP 108/88   Pulse 75   Ht 5\' 9"  (1.753 m)   Wt 212 lb 14.4 oz (96.6 kg)   BMI 31.44 kg/m  Physical Exam  Constitutional: She is oriented to person, place, and time. She appears well-developed and well-nourished. No distress.  HENT:  Head: Normocephalic and atraumatic.  Eyes: No scleral icterus.  Neck: Neck supple.  Cardiovascular: Normal rate.   Pulmonary/Chest: Effort normal.  Abdominal: Soft.  Musculoskeletal:  Neg SLR-normal ROM of both hips, normal leg lengths, normal tone, normal strength.  Neurological: She is alert and oriented to person, place, and time.  Skin: Skin is warm and dry.  3 x 2 cm sebaceous cyst on posterior left neck noted.  Psychiatric: She has a normal mood and affect.        Assessment & Plan:   Problem List Items Addressed This Visit    None    Visit Diagnoses    Left hip pain    -  Primary   ? bursitis, arthritis, trial of NSAID, hip exercises. check hip x-ray.   Relevant Medications   meloxicam (MOBIC) 15 MG tablet   Other Relevant Orders   DG HIP UNILAT WITH PELVIS 2-3 VIEWS LEFT (Completed)   Sebaceous cyst       discussed PCP for removal.      Total face-to-face time with patient: 15 minutes.  Over 50% of encounter was spent on counseling and coordination of care. Return in about 4 weeks (around 10/05/2017) for pp check and OGT.  Renee Schroeder 09/07/2017 12:29 PM

## 2017-09-29 ENCOUNTER — Ambulatory Visit: Payer: Self-pay | Admitting: Student

## 2017-10-13 ENCOUNTER — Ambulatory Visit: Payer: Self-pay | Admitting: Student

## 2018-05-28 ENCOUNTER — Emergency Department (HOSPITAL_COMMUNITY)
Admission: EM | Admit: 2018-05-28 | Discharge: 2018-05-28 | Disposition: A | Discharge status: Home / Self Care | Payer: Medicaid Other | Attending: Emergency Medicine | Admitting: Emergency Medicine

## 2018-05-28 ENCOUNTER — Encounter (HOSPITAL_COMMUNITY): Payer: Self-pay | Admitting: Emergency Medicine

## 2018-05-28 ENCOUNTER — Emergency Department (HOSPITAL_COMMUNITY): Payer: Medicaid Other

## 2018-05-28 ENCOUNTER — Other Ambulatory Visit: Payer: Self-pay

## 2018-05-28 DIAGNOSIS — L03317 Cellulitis of buttock: Secondary | ICD-10-CM | POA: Diagnosis not present

## 2018-05-28 DIAGNOSIS — F17210 Nicotine dependence, cigarettes, uncomplicated: Secondary | ICD-10-CM | POA: Diagnosis not present

## 2018-05-28 DIAGNOSIS — K6289 Other specified diseases of anus and rectum: Secondary | ICD-10-CM | POA: Diagnosis present

## 2018-05-28 LAB — BASIC METABOLIC PANEL
Anion gap: 6 (ref 5–15)
BUN: 10 mg/dL (ref 6–20)
CALCIUM: 9.6 mg/dL (ref 8.9–10.3)
CO2: 26 mmol/L (ref 22–32)
Chloride: 109 mmol/L (ref 101–111)
Creatinine, Ser: 0.97 mg/dL (ref 0.44–1.00)
GFR calc Af Amer: >60 mL/min (ref >60)
GLUCOSE: 94 mg/dL (ref 65–99)
POTASSIUM: 3.8 mmol/L (ref 3.5–5.1)
SODIUM: 141 mmol/L (ref 135–145)

## 2018-05-28 LAB — I-STAT BETA HCG BLOOD, ED (MC, WL, AP ONLY)

## 2018-05-28 LAB — CBC WITH DIFFERENTIAL/PLATELET
Abs Immature Granulocytes: 0 10*3/uL (ref 0.0–0.1)
BASOS ABS: 0 10*3/uL (ref 0.0–0.1)
BASOS PCT: 0 %
EOS ABS: 0.1 10*3/uL (ref 0.0–0.7)
EOS PCT: 2 %
HCT: 43.5 % (ref 36.0–46.0)
HEMOGLOBIN: 13.9 g/dL (ref 12.0–15.0)
Immature Granulocytes: 0 %
LYMPHS PCT: 25 %
Lymphs Abs: 1.8 10*3/uL (ref 0.7–4.0)
MCH: 28.7 pg (ref 26.0–34.0)
MCHC: 32 g/dL (ref 30.0–36.0)
MCV: 89.7 fL (ref 78.0–100.0)
MONO ABS: 0.5 10*3/uL (ref 0.1–1.0)
Monocytes Relative: 7 %
Neutro Abs: 4.9 10*3/uL (ref 1.7–7.7)
Neutrophils Relative %: 66 %
PLATELETS: 246 10*3/uL (ref 150–400)
RBC: 4.85 MIL/uL (ref 3.87–5.11)
RDW: 14.2 % (ref 11.5–15.5)
WBC: 7.5 10*3/uL (ref 4.0–10.5)

## 2018-05-28 IMAGING — CT CT ABD-PELV W/ CM
2 of 6 series · 17 of 46 positions shown, 19 images · IV contrast (Omni 300)
Comparison: CT scan of [DATE].

CLINICAL DATA: Acute rectal pain.

EXAM:
CT ABDOMEN AND PELVIS WITH CONTRAST
TECHNIQUE: Multidetector CT imaging of the abdomen and pelvis was performed
using the standard protocol following bolus administration of
intravenous contrast.
CONTRAST:  100mL OMNIPAQUE IOHEXOL 300 MG/ML  SOLN

[Series 3: a/p w/ 5mm · axial · 0.98mm/px · z∈[+710,+1120]mm · 14 of 92 slices shown, 16 images]
[im 5/92  soft-tissue]
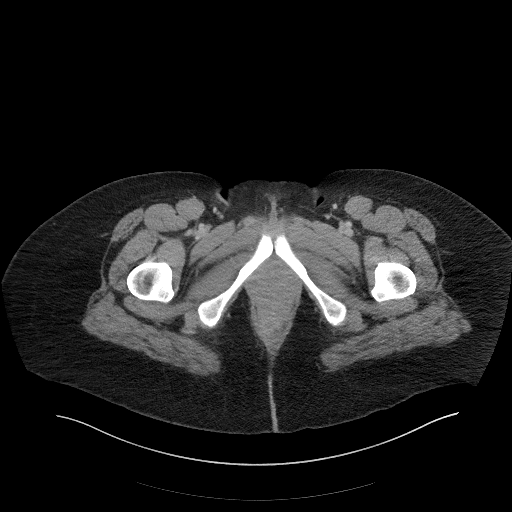
[im 5/92  bone]
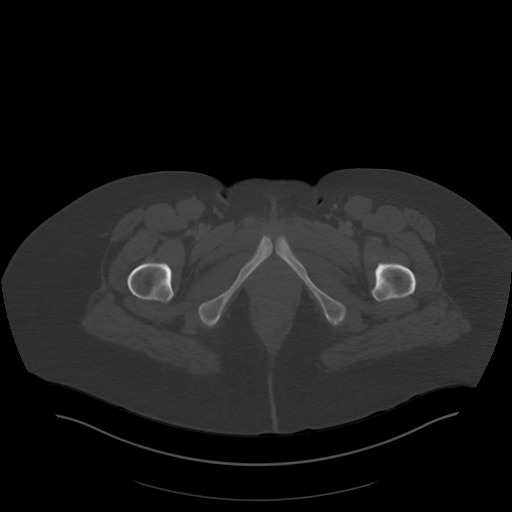
[im 10/92  soft-tissue]
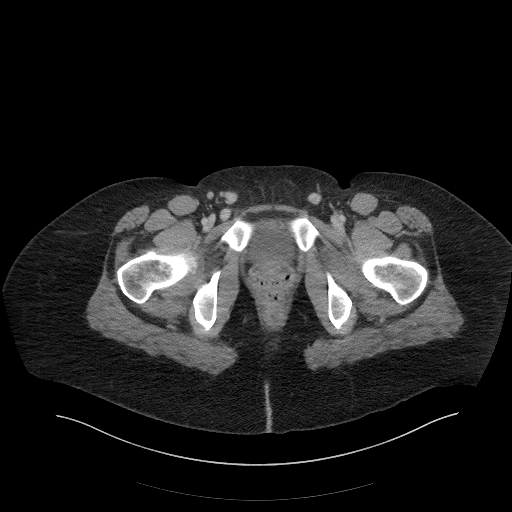
[im 20/92  soft-tissue]
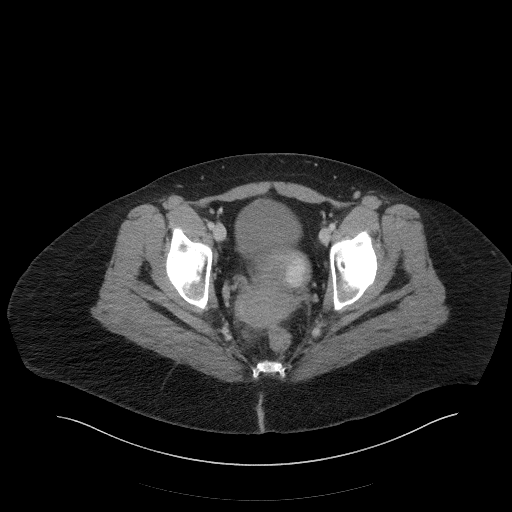
[im 24/92  soft-tissue]
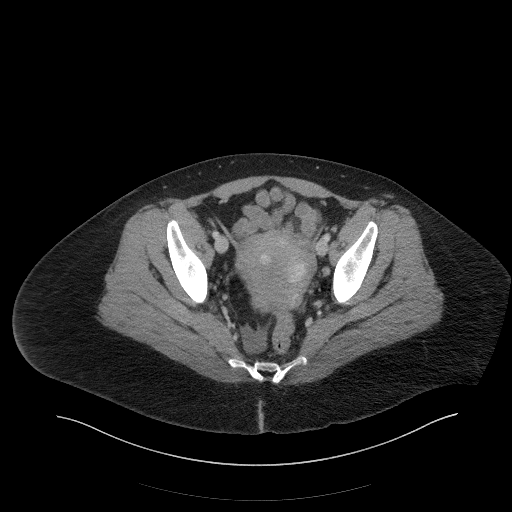
[im 29/92  soft-tissue]
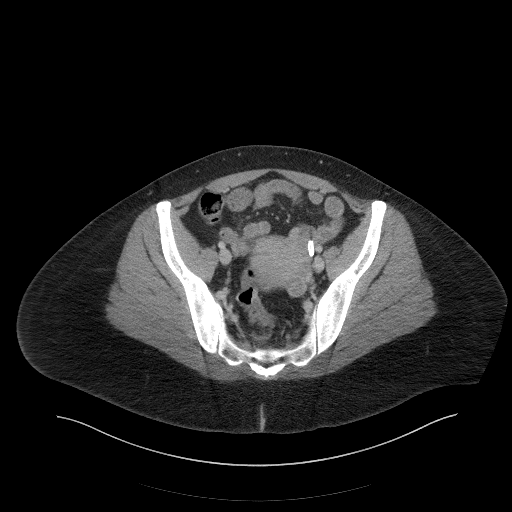
[im 39/92  soft-tissue]
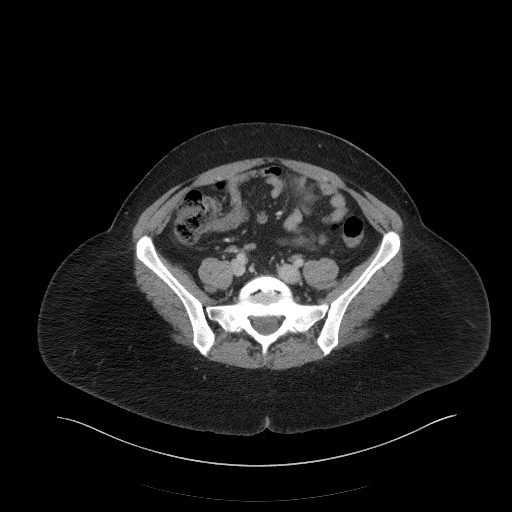
[im 44/92  soft-tissue]
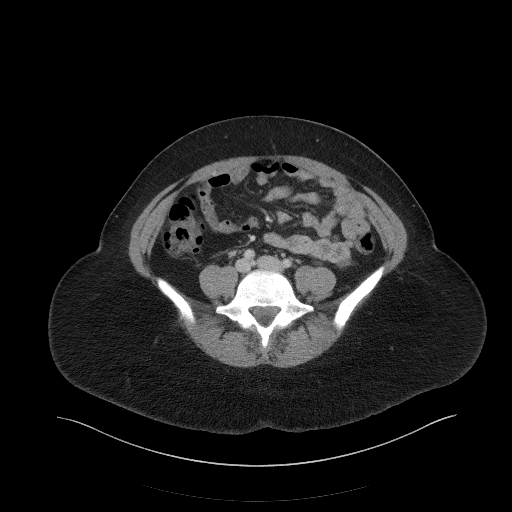
[im 48/92  soft-tissue]
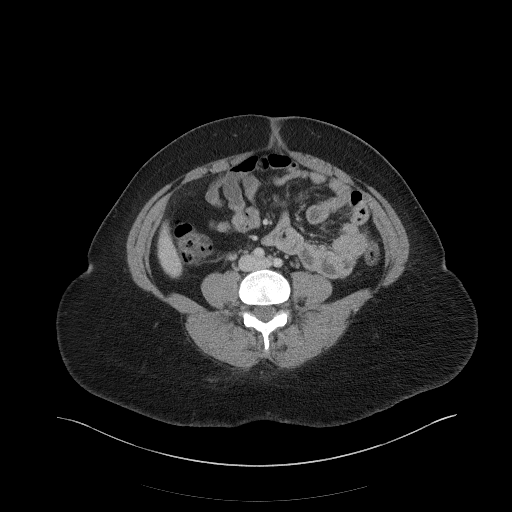
[im 53/92  soft-tissue]
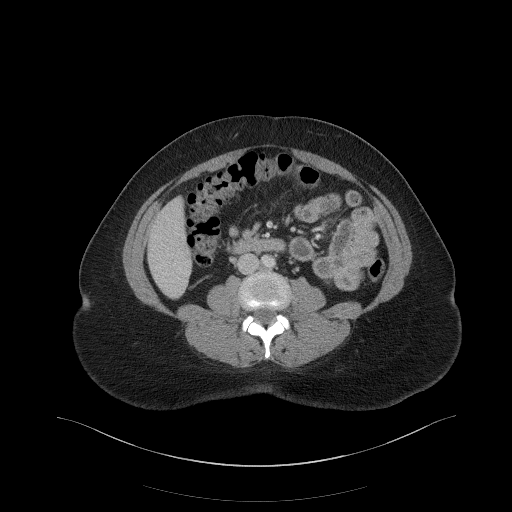
[im 53/92  bone]
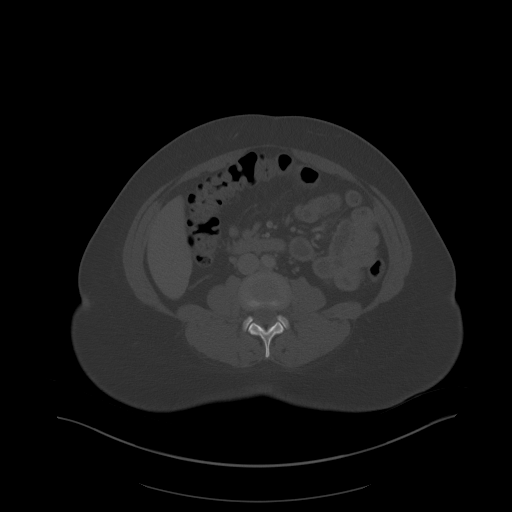
[im 63/92  soft-tissue]
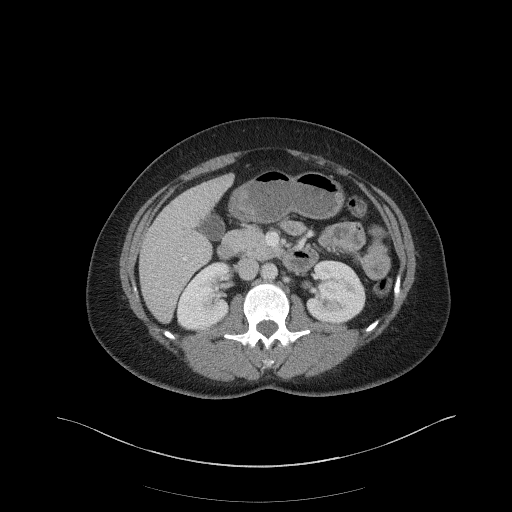
[im 68/92  soft-tissue]
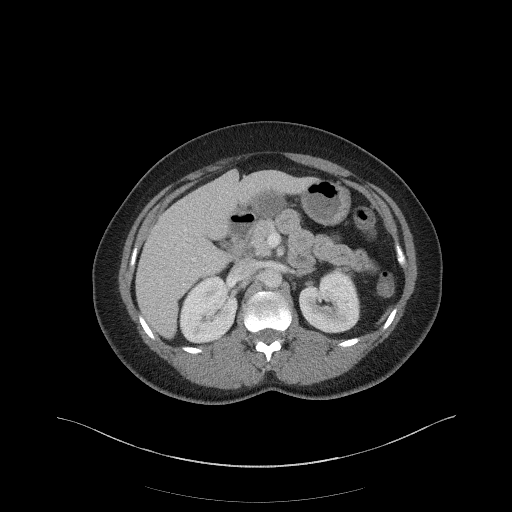
[im 72/92  soft-tissue]
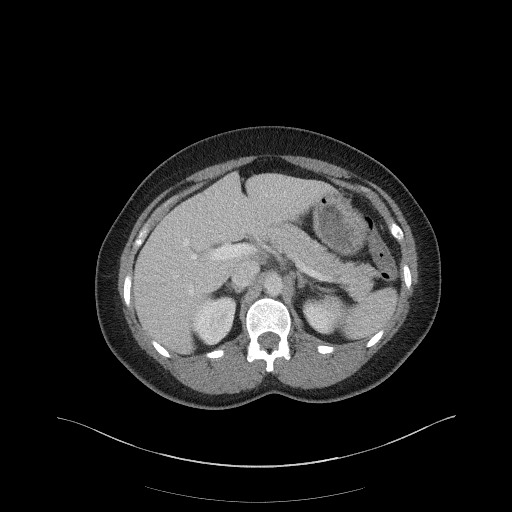
[im 82/92  soft-tissue]
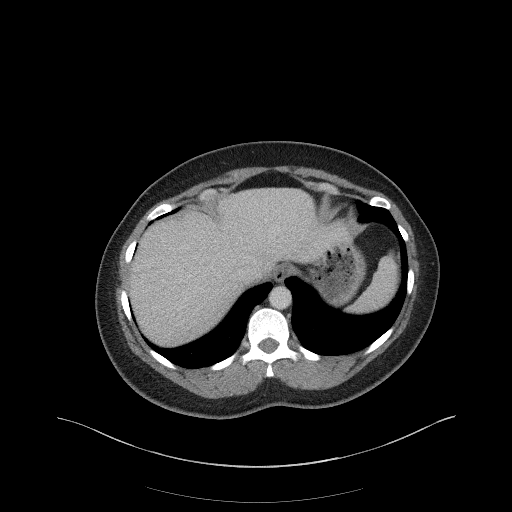
[im 87/92  soft-tissue]
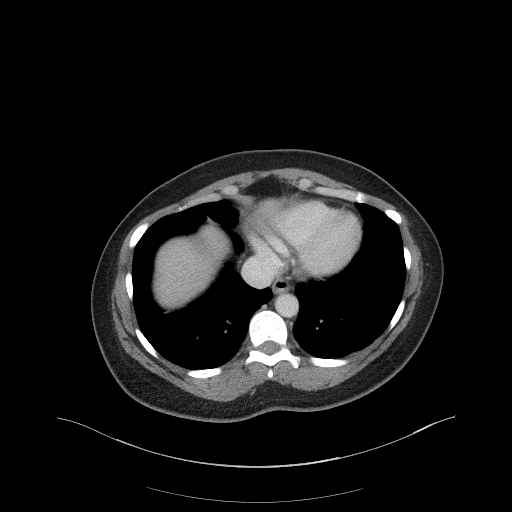

[Series 6: a/p w/ cor · coronal · 0.88mm/px · 3 of 151 slices shown]
[im 51/151  soft-tissue]
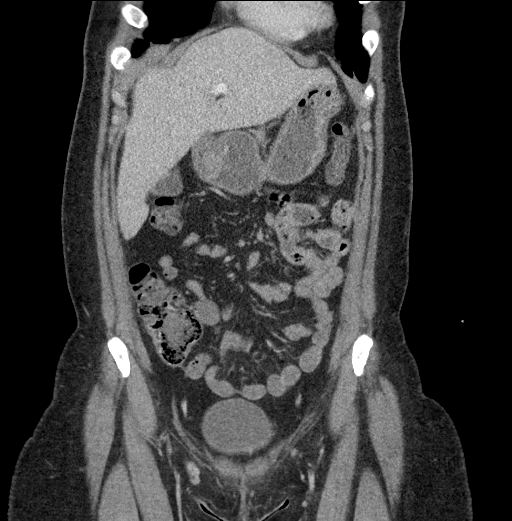
[im 67/151  soft-tissue]
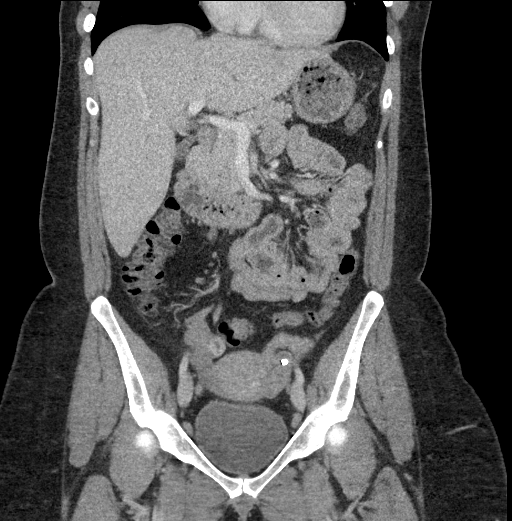
[im 84/151  soft-tissue]
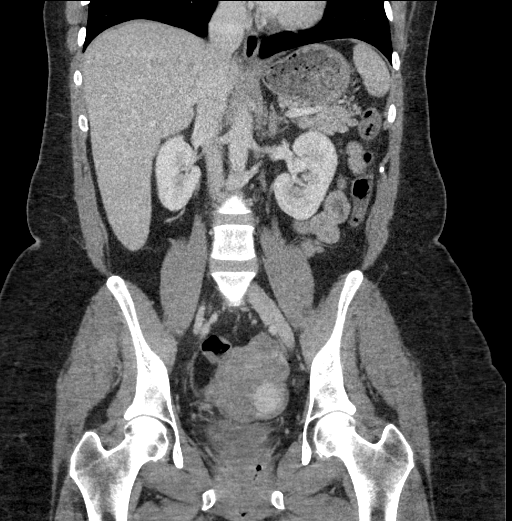

[17 of 46 positions shown; findings below may reference images not displayed]

FINDINGS: Lower chest: No acute abnormality.

Hepatobiliary: No focal liver abnormality is seen. No gallstones,
gallbladder wall thickening, or biliary dilatation.

Pancreas: Unremarkable. No pancreatic ductal dilatation or
surrounding inflammatory changes.

Spleen: Normal in size without focal abnormality.

Adrenals/Urinary Tract: Adrenal glands appear normal. Small right
renal cyst is noted. No hydronephrosis or renal obstruction is
noted. Urinary bladder is unremarkable. No renal or ureteral calculi
are noted.

Stomach/Bowel: Stomach is within normal limits. Appendix appears
normal. No evidence of bowel wall thickening, distention, or
inflammatory changes.

Vascular/Lymphatic: No significant vascular findings are present. No
enlarged abdominal or pelvic lymph nodes.

Reproductive: Stable uterine fibroid is noted. No adnexal
abnormality is noted.

Other: Small free fluid is noted in the pelvis. No hernia is noted.
No fluid collection or abscess is noted.

Musculoskeletal: No acute or significant osseous findings.
IMPRESSION: Stable uterine fibroid.

No definite evidence of abscess or abnormal fluid collection.

No acute abnormality seen in the abdomen or pelvis.

## 2018-05-28 MED ORDER — SODIUM CHLORIDE 0.9 % IV BOLUS
1000.0000 mL | Freq: Once | INTRAVENOUS | Status: AC
  Administered 2018-05-28: 1000 mL via INTRAVENOUS

## 2018-05-28 MED ORDER — DOXYCYCLINE HYCLATE 100 MG PO CAPS: 100.0000 mg | ORAL_CAPSULE | Freq: Two times a day (BID) | ORAL | 0 refills | Status: DC

## 2018-05-28 MED ORDER — ONDANSETRON HCL 4 MG/2ML IJ SOLN
4.0000 mg | Freq: Once | INTRAMUSCULAR | Status: AC
  Administered 2018-05-28: 4 mg via INTRAVENOUS
  Filled 2018-05-28: qty 2

## 2018-05-28 MED ORDER — IOHEXOL 300 MG/ML  SOLN
100.0000 mL | Freq: Once | INTRAMUSCULAR | Status: AC | PRN
  Administered 2018-05-28: 100 mL via INTRAVENOUS

## 2018-05-28 MED ORDER — MORPHINE SULFATE (PF) 4 MG/ML IV SOLN
4.0000 mg | Freq: Once | INTRAVENOUS | Status: AC
  Administered 2018-05-28: 4 mg via INTRAVENOUS
  Filled 2018-05-28: qty 1

## 2018-05-28 NOTE — Discharge Instructions (Addendum)
Return here as needed.  Follow-up with the surgeon provided.

## 2018-05-28 NOTE — ED Triage Notes (Addendum)
C/o rectal abscess x 3 days.  History of same.  Denies fever/chills.

## 2018-05-28 NOTE — ED Notes (Signed)
Patient transported to CT 

## 2018-05-28 NOTE — ED Provider Notes (Signed)
[place] EMERGENCY DEPARTMENT Provider Note   CSN: [id number] Arrival date & time: 05/28/18  1459     History   Chief Complaint Chief Complaint  Patient presents with  . Abscess    HPI Renee Schroeder is a 42 y.o. female.  HPI   Renee Schroeder is a 42 y.o. female, with a history of rectal abscess and hidradenitis, presenting to the ED with rectal pain for the last 3 days.  States she has had similar pain with a rectal abscess 2 years ago (March 14, 2016), for which she underwent I&D in the OR. Pain today is sharp, 9/10, radiating internally.  Denies fever/chills, drainage, abdominal pain, hematochezia/melena, N/V/D, or any other complaints. LMP May 06, 2018.  She is not breast-feeding.  Past Medical History:  Diagnosis Date  . Gestational diabetes   . Hydradenitis   . No pertinent past medical history     Patient Active Problem List   Diagnosis Date Noted  . Normal labor 08/21/2017  . Encounter for sterilization 08/21/2017  . History of hidradenitis suppurativa 07/06/2017  . Urinary tract infection in mother during second trimester of pregnancy 04/28/2017  . Supervision of high risk pregnancy, antepartum 04/11/2017  . GDM, class A2 04/11/2017  . AMA (advanced maternal age) multigravida 35+ 04/11/2017    Past Surgical History:  Procedure Laterality Date  . DILATION AND CURETTAGE OF UTERUS    . IRRIGATION AND DEBRIDEMENT ABSCESS N/A 03/14/2016   Procedure: IRRIGATION AND DEBRIDEMENT ABSCESS;  Surgeon: Jackolyn Confer, MD;  Location: [facility];  Service: General;  Laterality: N/A;  . LAPAROSCOPY  05/26/2012   Procedure: LAPAROSCOPY OPERATIVE;  Surgeon: Mora Bellman, MD;  Location: [facility];  Service: Gynecology;  Laterality: N/A;  Operative Laparoscopy with Right Salpingectomy  . Sweat glands removed from rt. under arm Right   . TUBAL LIGATION Left 08/21/2017   Procedure: LEFT POST PARTUM TUBAL LIGATION;  Surgeon: Lavonia Drafts, MD;  Location:  [facility];  Service: Gynecology;  Laterality: Left;  History of Right Salpingectomy     OB History    Gravida  5   Para  3   Term  3   Preterm  0   AB  2   Living  3     SAB  0   TAB  1   Ectopic  1   Multiple  0   Live Births  3            Home Medications    Prior to Admission medications   Medication Sig Start Date End Date Taking? Authorizing Provider  acetaminophen (TYLENOL) 500 MG tablet Take 500 mg by mouth every 6 (six) hours as needed for mild pain or headache.    [provider]  meloxicam (MOBIC) 15 MG tablet Take 1 tablet (15 mg total) by mouth daily. 09/07/17   Donnamae Jude, MD    Family History Family History  Problem Relation Age of Onset  . Other Neg Hx     Social History Social History   Tobacco Use  . Smoking status: Current Every Day Smoker    Packs/day: 0.50    Types: Cigarettes  . Smokeless tobacco: Never Used  Substance Use Topics  . Alcohol use: No  . Drug use: No     Allergies   Patient has no known allergies.   Review of Systems Review of Systems  Constitutional: Negative for chills and fever.  Gastrointestinal: Positive for rectal pain. Negative  for abdominal pain, blood in stool, diarrhea, nausea and vomiting.  Musculoskeletal: Negative for back pain.  All other systems reviewed and are negative.    Physical Exam Updated Vital Signs BP 114/70 (BP Location: Right Arm)   Pulse 91   Temp 98.4 F (36.9 C) (Oral)   Resp 16   LMP 05/06/2018   SpO2 100%   Physical Exam  Constitutional: She appears well-developed and well-nourished. No distress.  HENT:  Head: Normocephalic and atraumatic.  Eyes: Conjunctivae are normal.  Neck: Neck supple.  Cardiovascular: Normal rate, regular rhythm and intact distal pulses.  Pulmonary/Chest: Effort normal. No respiratory distress.  Abdominal: Soft. There is no tenderness. There is no guarding.  Genitourinary:  Genitourinary Comments: Tenderness to the  right anal opening into the right rectal wall.  No noted drainage, gross blood, or stool burden.  PA, Mickel Baas, served as chaperone during the exam.  Musculoskeletal: She exhibits no edema.  Lymphadenopathy:    She has no cervical adenopathy.  Neurological: She is alert.  Skin: Skin is warm and dry. She is not diaphoretic.  Psychiatric: She has a normal mood and affect. Her behavior is normal.  Nursing note and vitals reviewed.    ED Treatments / Results  Labs (all labs ordered are listed, but only abnormal results are displayed) Labs Reviewed  CBC WITH DIFFERENTIAL/PLATELET  BASIC METABOLIC PANEL  I-STAT BETA HCG BLOOD, ED ([facility], [facility], [facility] ONLY)    EKG None  Radiology No results found.  Procedures Procedures (including critical care time)  Medications Ordered in ED Medications  sodium chloride 0.9 % bolus 1,000 mL (1,000 mLs Intravenous New Bag/Given 05/28/18 1636)  morphine 4 MG/ML injection 4 mg (4 mg Intravenous Given 05/28/18 1636)  ondansetron (ZOFRAN) injection 4 mg (4 mg Intravenous Given 05/28/18 1636)     Initial Impression / Assessment and Plan / ED Course  I have reviewed the triage vital signs and the nursing notes.  Pertinent labs & imaging results that were available during my care of the patient were reviewed by me and considered in my medical decision making (see chart for details).     Patient presents with rectal pain.  Concern for possible rectal abscess on exam and based on history. Patient is nontoxic appearing, afebrile, not tachycardic, not tachypneic, not hypotensive, and is in no apparent distress.   End of shift patient care handoff report given to Irena Cords, PA-C. Plan: CT scan looking for rectal abscess. General surgery consult as indicated.    Final Clinical Impressions(s) / ED Diagnoses   Final diagnoses:  None    ED Discharge Orders    None       Layla Maw 05/28/18 1733    Quintella Reichert, MD 05/31/18 1240

## 2019-02-04 ENCOUNTER — Ambulatory Visit (HOSPITAL_COMMUNITY)
Admission: EM | Admit: 2019-02-04 | Discharge: 2019-02-04 | Disposition: A | Discharge status: Home / Self Care | Payer: Medicaid Other | Attending: Family Medicine | Admitting: Family Medicine

## 2019-02-04 ENCOUNTER — Encounter (HOSPITAL_COMMUNITY): Payer: Self-pay | Admitting: *Deleted

## 2019-02-04 DIAGNOSIS — L0291 Cutaneous abscess, unspecified: Secondary | ICD-10-CM

## 2019-02-04 MED ORDER — TRAMADOL HCL 50 MG PO TABS: 50.0000 mg | ORAL_TABLET | Freq: Four times a day (QID) | ORAL | 0 refills | Status: DC | PRN

## 2019-02-04 MED ORDER — SULFAMETHOXAZOLE-TRIMETHOPRIM 800-160 MG PO TABS: 1.0000 | ORAL_TABLET | Freq: Two times a day (BID) | ORAL | 0 refills | Status: AC

## 2019-02-04 NOTE — Discharge Instructions (Addendum)
Put warm compresses on the infection for 20 minutes every couple of hours Take Tylenol or Advil for mild pain Take tramadol for more severe pain. Take antibiotic 2 times a day with food Return if this fails to improve over the next several days

## 2019-02-04 NOTE — ED Triage Notes (Signed)
Reports starting with abscess to base of scalp 2 days ago with worsening.

## 2019-02-04 NOTE — ED Provider Notes (Signed)
[facility]    CSN: [id number] Arrival date & time: 02/04/19  1602     History   Chief Complaint Chief Complaint  Patient presents with  . Abscess    HPI Renee Schroeder is a 43 y.o. female.   HPI  Patient states she has recurring skin infections.  She currently has a bump on the back of her neck that is very painful.  It hurts to sleep at night, it hurts to turn her head.  She states that it is going to becoming more painful.  She is here to see if it needs to be drained  Past Medical History:  Diagnosis Date  . Gestational diabetes   . Hydradenitis     Patient Active Problem List   Diagnosis Date Noted  . Normal labor 08/21/2017  . Encounter for sterilization 08/21/2017  . History of hidradenitis suppurativa 07/06/2017  . Urinary tract infection in mother during second trimester of pregnancy 04/28/2017  . Supervision of high risk pregnancy, antepartum 04/11/2017  . GDM, class A2 04/11/2017  . AMA (advanced maternal age) multigravida 35+ 04/11/2017    Past Surgical History:  Procedure Laterality Date  . DILATION AND CURETTAGE OF UTERUS    . ECTOPIC PREGNANCY SURGERY    . IRRIGATION AND DEBRIDEMENT ABSCESS N/A 03/14/2016   Procedure: IRRIGATION AND DEBRIDEMENT ABSCESS;  Surgeon: Jackolyn Confer, MD;  Location: [facility];  Service: General;  Laterality: N/A;  . LAPAROSCOPY  05/26/2012   Procedure: LAPAROSCOPY OPERATIVE;  Surgeon: Mora Bellman, MD;  Location: [facility];  Service: Gynecology;  Laterality: N/A;  Operative Laparoscopy with Right Salpingectomy  . SALPINGECTOMY    . Sweat glands removed from rt. under arm Right   . TUBAL LIGATION Left 08/21/2017   Procedure: LEFT POST PARTUM TUBAL LIGATION;  Surgeon: Lavonia Drafts, MD;  Location: [facility];  Service: Gynecology;  Laterality: Left;  History of Right Salpingectomy    OB History    Gravida  5   Para  3   Term  3   Preterm  0   AB  2   Living  3     SAB  0   TAB  1   Ectopic  1   Multiple  0   Live Births  3            Home Medications    Prior to Admission medications   Medication Sig Start Date End Date Taking? Authorizing Provider  sulfamethoxazole-trimethoprim (BACTRIM DS,SEPTRA DS) 800-160 MG tablet Take 1 tablet by mouth 2 (two) times daily for 7 days. 02/04/19 02/11/19  Raylene Everts, MD  traMADol (ULTRAM) 50 MG tablet Take 1 tablet (50 mg total) by mouth every 6 (six) hours as needed. 02/04/19   Raylene Everts, MD    Family History Family History  Problem Relation Age of Onset  . Healthy Mother   . Healthy Father   . Other Neg Hx     Social History Social History   Tobacco Use  . Smoking status: Current Every Day Smoker    Packs/day: 0.50    Types: Cigarettes  . Smokeless tobacco: Never Used  Substance Use Topics  . Alcohol use: No  . Drug use: No     Allergies   Patient has no known allergies.   Review of Systems Review of Systems  Constitutional: Negative for chills and fever.  HENT: Negative for ear pain and sore throat.   Eyes: Negative for pain and visual  disturbance.  Respiratory: Negative for cough and shortness of breath.   Cardiovascular: Negative for chest pain and palpitations.  Gastrointestinal: Negative for abdominal pain and vomiting.  Genitourinary: Negative for dysuria and hematuria.  Musculoskeletal: Positive for neck pain and neck stiffness. Negative for arthralgias and back pain.  Skin: Positive for wound. Negative for color change and rash.  Neurological: Negative for seizures and syncope.  All other systems reviewed and are negative.    Physical Exam Triage Vital Signs ED Triage Vitals  Enc Vitals Group     BP 02/04/19 1624 125/72     Pulse Rate 02/04/19 1624 76     Resp 02/04/19 1624 16     Temp 02/04/19 1624 98.5 F (36.9 C)     Temp Source 02/04/19 1624 Oral     SpO2 02/04/19 1624 100 %     Weight --      Height --      Head Circumference --      Peak Flow --      Pain  Score 02/04/19 1627 10     Pain Loc --      Pain Edu? --      Excl. in [place]? --    No data found.  Updated Vital Signs BP 125/72 (BP Location: Right Arm)   Pulse 76   Temp 98.5 F (36.9 C) (Oral)   Resp 16   LMP 01/10/2019 (Approximate)   SpO2 100%   Breastfeeding No   Visual Acuity Right Eye Distance:   Left Eye Distance:   Bilateral Distance:    Right Eye Near:   Left Eye Near:    Bilateral Near:     Physical Exam Constitutional:      General: She is not in acute distress.    Appearance: She is well-developed.  HENT:     Head: Normocephalic and atraumatic.  Eyes:     Conjunctiva/sclera: Conjunctivae normal.     Pupils: Pupils are equal, round, and reactive to light.  Neck:     Musculoskeletal: Normal range of motion. Muscular tenderness present.      Comments: Stiff posture.  Slow range of motion. Cardiovascular:     Rate and Rhythm: Normal rate.  Pulmonary:     Effort: Pulmonary effort is normal. No respiratory distress.  Abdominal:     General: There is no distension.     Palpations: Abdomen is soft.  Musculoskeletal: Normal range of motion.  Skin:    General: Skin is warm and dry.  Neurological:     Mental Status: She is alert.      UC Treatments / Results  Labs (all labs ordered are listed, but only abnormal results are displayed) Labs Reviewed - No data to display  EKG None  Radiology No results found.  Procedures Procedures (including critical care time)  Medications Ordered in UC Medications - No data to display  Initial Impression / Assessment and Plan / UC Course  I have reviewed the triage vital signs and the nursing notes.  Pertinent labs & imaging results that were available during my care of the patient were reviewed by me and considered in my medical decision making (see chart for details).     Abscess.  Not ready to drain.  Will try warm compresses and antibiotic.  This may drain spontaneously.  If not she is welcome to  come back for assistance Final Clinical Impressions(s) / UC Diagnoses   Final diagnoses:  Abscess     Discharge Instructions  Put warm compresses on the infection for 20 minutes every couple of hours Take Tylenol or Advil for mild pain Take tramadol for more severe pain. Take antibiotic 2 times a day with food Return if this fails to improve over the next several days   ED Prescriptions    Medication Sig Dispense Auth. Provider   traMADol (ULTRAM) 50 MG tablet Take 1 tablet (50 mg total) by mouth every 6 (six) hours as needed. 15 tablet Raylene Everts, MD   sulfamethoxazole-trimethoprim (BACTRIM DS,SEPTRA DS) 800-160 MG tablet Take 1 tablet by mouth 2 (two) times daily for 7 days. 14 tablet Raylene Everts, MD     Controlled Substance Prescriptions [place] Controlled Substance Registry consulted? yes   Raylene Everts, MD 02/04/19 [phone]

## 2019-08-01 ENCOUNTER — Other Ambulatory Visit (HOSPITAL_COMMUNITY): Payer: Self-pay | Admitting: *Deleted

## 2019-08-01 DIAGNOSIS — N6320 Unspecified lump in the left breast, unspecified quadrant: Secondary | ICD-10-CM

## 2019-09-04 ENCOUNTER — Ambulatory Visit (HOSPITAL_COMMUNITY): Payer: Self-pay

## 2019-09-04 ENCOUNTER — Other Ambulatory Visit: Payer: Self-pay

## 2019-09-20 ENCOUNTER — Ambulatory Visit
Admission: RE | Admit: 2019-09-20 | Discharge: 2019-09-20 | Disposition: A | Discharge status: Home / Self Care | Payer: No Typology Code available for payment source | Source: Ambulatory Visit | Attending: Obstetrics and Gynecology | Admitting: Obstetrics and Gynecology

## 2019-09-20 ENCOUNTER — Other Ambulatory Visit: Payer: Self-pay

## 2019-09-20 ENCOUNTER — Encounter (HOSPITAL_COMMUNITY): Payer: Self-pay

## 2019-09-20 ENCOUNTER — Ambulatory Visit (HOSPITAL_COMMUNITY)
Admission: RE | Admit: 2019-09-20 | Discharge: 2019-09-20 | Disposition: A | Discharge status: Home / Self Care | Payer: Self-pay | Source: Ambulatory Visit | Attending: Family Medicine | Admitting: Family Medicine

## 2019-09-20 ENCOUNTER — Ambulatory Visit (HOSPITAL_COMMUNITY): Payer: Self-pay

## 2019-09-20 DIAGNOSIS — N6320 Unspecified lump in the left breast, unspecified quadrant: Secondary | ICD-10-CM

## 2019-09-20 DIAGNOSIS — Z1239 Encounter for other screening for malignant neoplasm of breast: Secondary | ICD-10-CM

## 2019-09-20 DIAGNOSIS — N6325 Unspecified lump in the left breast, overlapping quadrants: Secondary | ICD-10-CM

## 2019-09-20 NOTE — Patient Instructions (Addendum)
Explained breast self awareness with Renee Schroeder. Let patient know [facility] will cover Pap smears every 3 years unless has a history of abnormal Pap smears. Referred patient to the [facility] for a diagnostic mammogram and left breast ultrasound. Appointment scheduled for Thursday, September 20, 2019 at 1450. Patient aware of appointment and will be there. Let patient know will follow up with her within the next couple weeks with results of Pap smear by letter or phone.Discussed smoking cessation with patient. Referred to the [facility] Quitline and gave resources to the free smoking cessation classes at [facility].  Renee Schroeder verbalized understanding.  Renee Schroeder, Arvil [place], RN 12:43 PM

## 2019-09-20 NOTE — Addendum Note (Signed)
Encounter addended by: Loletta Parish, RN on: 09/20/2019 1:24 PM  Actions taken: Clinical Note Signed

## 2019-09-20 NOTE — Progress Notes (Signed)
Complaints of a left breast lump x 2 months that patient states has decreased in size. Patient states the left breast lump is tender to touch.  Pap Smear: Pap smear not completed today. Last Pap smear was 03/24/2017 at [facility] and normal. Per patient has no history of an abnormal Pap smear. Last Pap smear result is in Epic.  Physical exam: Breasts Breasts symmetrical. Healed sores bilateral axilla from hidradenitis. No nipple retraction bilateral breasts. No nipple discharge bilateral breasts. No lymphadenopathy. Multiple lumps palpated bilateral axilla from healed hidradenitis. Palpated a lump within the left breast at 9 o'clock next to the nipple. No complaints of pain or tenderness on exam. Referred patient to the [facility] for a diagnostic mammogram and left breast ultrasound. Appointment scheduled for Thursday, September 20, 2019 at 1450.        Pelvic/Bimanual No Pap smear completed today since last Pap smear was 03/24/2017. Pap smear not indicated per BCCCP guidelines.   Smoking History: Patient is a current smoker. Discussed smoking cessation with patient. Referred to the [facility] Quitline and gave resources to the free smoking cessation classes at [facility].  Patient Navigation: Patient education provided. Access to services provided for patient through B[facility]CP program.   Breast and Cervical Cancer Risk Assessment: Patient has no family history of breast cancer, known genetic mutations, or radiation treatment to the chest before age 65. Patient has no history of cervical dysplasia, immunocompromised, or DES exposure in-utero.  Risk Assessment    Risk Scores      09/20/2019   Last edited by: Loletta Parish, RN   5-year risk: 0.9 %   Lifetime risk: 9.3 %

## 2019-10-17 ENCOUNTER — Ambulatory Visit: Payer: Self-pay

## 2019-11-21 ENCOUNTER — Ambulatory Visit: Payer: Self-pay

## 2019-12-19 ENCOUNTER — Other Ambulatory Visit: Payer: Self-pay | Admitting: Obstetrics and Gynecology

## 2019-12-19 ENCOUNTER — Other Ambulatory Visit: Payer: Self-pay

## 2019-12-19 ENCOUNTER — Inpatient Hospital Stay: Payer: Self-pay | Attending: Obstetrics and Gynecology | Admitting: *Deleted

## 2019-12-19 VITALS — BP 124/78 | Temp 97.3°F | Ht 69.0 in | Wt 210.0 lb

## 2019-12-19 DIAGNOSIS — Z0000 Encounter for general adult medical examination without abnormal findings: Secondary | ICD-10-CM

## 2019-12-19 NOTE — Progress Notes (Signed)
[facility] initial screening     Clinical Measurement:  Height: 69 in Weight: 210 lb  Blood Pressure: 118/78  Blood Pressure #2: 124/78 Fasting Labs Drawn Today, will review with patient when they result.   Medical History:  Patient states that she does have a history of high cholesterol. Patient does not have a history of hypertension or diabetes.  Medications:  Patient states that she does not take medication to lower cholesterol, blood pressure or blood sugar. Patient does not take an aspirin a day to help prevent a heart attack or stroke.    Blood pressure, self measurement: Patient states that she does not measure blood pressure from home and has not been told to do so by a healthcare provider.   Nutrition: Patient states that on average she eats 0 cups of fruit and 1 cup of vegetables per day. Patient states that she does eat fish at least 2 times per week. Patient eats less than half servings of whole grains. Patient does not drink less than 36 ounces of beverages with added sugar weekly. Patient is not currently watching sodium or salt intake. In the past 7 days patient has not had any drinks containing alcohol. On average patient does not drink any drinks containing alcohol.      Physical activity:  Patient states that she gets 210 minutes of moderate and 0 minutes of vigorous physical activity each week.  Smoking status:  Patient states that she is a current smoker.   Quality of life:  Over the past 2 weeks patient states that she has not had any days where she has little interest or pleasure in doing things and 0 days where she has felt down, depressed or hopeless.    Risk reduction and counseling:    Health Coaching: Encouraged patient to try and add more fruits and vegetables into her daily diet. Explained that the recommendation is for 2 cups of fruit and 3 cups of vegetables daily. Encouraged patient to also try and add more whole grains into diet. Patient stated that she currently  consumes around 20 cups of sodas/beverages with added sugars weekly. Encouraged her to try and start cutting back slowly on the amount of sodas that she consumes as well as watching the amount of salt that she consumes.    Quitline: Patient stated that she is ready to try and quit smoking. Told patient about the Quitline. Patient stated that she did want me to complete the referral for her. Also told the patient about the smoking cessation classes offered through [facility]. Told the patient that if she decides that she wants to participant in one of these classes to call me and I can register her for one.  Navigation:  I will notify patient of lab results. Patient is aware of 2 more health coaching sessions and a follow up.  Time: 20 minutes

## 2019-12-20 LAB — LIPID PANEL W/O CHOL/HDL RATIO
Cholesterol, Total: 166 mg/dL (ref 100–199)
HDL: 53 mg/dL (ref >39)
LDL Chol Calc (NIH): 102 mg/dL — ABNORMAL HIGH (ref 0–99)
Triglycerides: 56 mg/dL (ref 0–149)
VLDL Cholesterol Cal: 11 mg/dL (ref 5–40)

## 2019-12-20 LAB — GLUCOSE, RANDOM: Glucose: 96 mg/dL (ref 65–99)

## 2019-12-20 LAB — HGB A1C W/O EAG: Hgb A1c MFr Bld: 5.9 % — ABNORMAL HIGH (ref 4.8–5.6)

## 2019-12-25 ENCOUNTER — Telehealth: Payer: Self-pay

## 2019-12-25 NOTE — Telephone Encounter (Signed)
Health coaching 2     Labs- 166 cholesterol, 102 LDL cholesterol , 56 triglycerides , 53 HDL cholesterol , 5.9 hemoglobin A1C , 96 mean plasma glucose  Patient understands and is aware of her lab results.   Goals- Discussed lab results with patient and answered any questions that patient had regarding results.  Goals- Decrease the amount of sodas consumed. Patient currently drinks around 20 cups of soda a week. Encouraged patient to start cutting back slowly on the amount she drinks. Explained that eliminating sodas completely at first could result in caffeine withdrawals so its best to do it slowly. Reduce the amounts of sweets and carbs consumed. Reduce the amount of fried and fatty foods consumed. Encouraged patient to try grilling, baking, broiling or sauteeing foods instead. Continue trying to get at least 20 minutes of exercise daily.  Smoking Cessation: Patient stated that she has been trying to cut back on the amount of cigarettes that she smokes daily. Patient stated that yesterday she only smoked 4 cigarettes. Patient has been in touch with the Quitline. Gave patient some smoking cessation tips.   Navigation:  Patient is aware of 1 more health coaching sessions and a follow up. Patient is scheduled for follow-up with Internal Medicine on Wednesday, January 27 @ 1:15 pm  Time-  11 minutes

## 2020-01-02 ENCOUNTER — Encounter: Payer: No Typology Code available for payment source | Admitting: Internal Medicine

## 2020-07-30 ENCOUNTER — Telehealth: Payer: Self-pay

## 2020-07-30 NOTE — Telephone Encounter (Signed)
Health Coaching 3    Goals- Patient has cut back on sodas. Patient has also been trying to reduce the amount of sweets and sugars consumed. Patient has also been trying to reduce the amount of rice and potatoes that she consumes.   New goal- Try to start adding in daily exercise. 20 minutes a day for a goal of 150 minutes a week.   Barrier to reaching goal-    Strategies to overcome-    Navigation:  Patient is aware of  a follow up session. Patient is scheduled for follow-up appointment on August 20, 2020 @ 10:00 am.   Time- 10 minutes

## 2020-08-20 ENCOUNTER — Ambulatory Visit: Payer: No Typology Code available for payment source

## 2020-09-09 ENCOUNTER — Telehealth: Payer: Self-pay

## 2020-09-09 NOTE — Telephone Encounter (Signed)
Left message for patient about rescheduling wise woman appointment. Left name and number for patient to call back.

## 2020-09-19 ENCOUNTER — Encounter (HOSPITAL_COMMUNITY): Payer: Self-pay

## 2022-05-24 ENCOUNTER — Ambulatory Visit (HOSPITAL_COMMUNITY)
Admission: EM | Admit: 2022-05-24 | Discharge: 2022-05-24 | Disposition: A | Discharge status: Home / Self Care | Payer: Commercial Managed Care - HMO | Attending: Internal Medicine | Admitting: Internal Medicine

## 2022-05-24 ENCOUNTER — Encounter (HOSPITAL_COMMUNITY): Payer: Self-pay | Admitting: *Deleted

## 2022-05-24 ENCOUNTER — Other Ambulatory Visit: Payer: Self-pay

## 2022-05-24 DIAGNOSIS — L732 Hidradenitis suppurativa: Secondary | ICD-10-CM

## 2022-05-24 LAB — BASIC METABOLIC PANEL
Anion gap: 7 (ref 5–15)
BUN: 10 mg/dL (ref 6–20)
CO2: 26 mmol/L (ref 22–32)
Calcium: 9.3 mg/dL (ref 8.9–10.3)
Chloride: 108 mmol/L (ref 98–111)
Creatinine, Ser: 0.82 mg/dL (ref 0.44–1.00)
GFR, Estimated: >60 mL/min (ref >60)
Glucose, Bld: 87 mg/dL (ref 70–99)
Potassium: 4.7 mmol/L (ref 3.5–5.1)
Sodium: 141 mmol/L (ref 135–145)

## 2022-05-24 LAB — CBC WITH DIFFERENTIAL/PLATELET
Abs Immature Granulocytes: 0.02 10*3/uL (ref 0.00–0.07)
Basophils Absolute: 0 10*3/uL (ref 0.0–0.1)
Basophils Relative: 0 %
Eosinophils Absolute: 0.1 10*3/uL (ref 0.0–0.5)
Eosinophils Relative: 2 %
HCT: 43.1 % (ref 36.0–46.0)
Hemoglobin: 14.3 g/dL (ref 12.0–15.0)
Immature Granulocytes: 0 %
Lymphocytes Relative: 49 %
Lymphs Abs: 2.3 10*3/uL (ref 0.7–4.0)
MCH: 29.8 pg (ref 26.0–34.0)
MCHC: 33.2 g/dL (ref 30.0–36.0)
MCV: 89.8 fL (ref 80.0–100.0)
Monocytes Absolute: 0.4 10*3/uL (ref 0.1–1.0)
Monocytes Relative: 8 %
Neutro Abs: 2 10*3/uL (ref 1.7–7.7)
Neutrophils Relative %: 41 %
Platelets: 263 10*3/uL (ref 150–400)
RBC: 4.8 MIL/uL (ref 3.87–5.11)
RDW: 13.9 % (ref 11.5–15.5)
WBC: 4.8 10*3/uL (ref 4.0–10.5)
nRBC: 0 % (ref 0.0–0.2)

## 2022-05-24 MED ORDER — DOXYCYCLINE HYCLATE 100 MG PO CAPS: 100.0000 mg | ORAL_CAPSULE | Freq: Two times a day (BID) | ORAL | 0 refills | Status: DC

## 2022-05-24 MED ORDER — IBUPROFEN 600 MG PO TABS: 600.0000 mg | ORAL_TABLET | Freq: Four times a day (QID) | ORAL | 0 refills | Status: DC | PRN

## 2022-05-24 MED ORDER — IBUPROFEN 800 MG PO TABS
ORAL_TABLET | ORAL | Status: AC
  Filled 2022-05-24: qty 1

## 2022-05-24 MED ORDER — IBUPROFEN 800 MG PO TABS
800.0000 mg | ORAL_TABLET | Freq: Once | ORAL | Status: AC
  Administered 2022-05-24: 800 mg via ORAL

## 2022-05-24 NOTE — ED Triage Notes (Signed)
Pt reports Rt breast pain due to HS . Pt also reports drainage from site.

## 2022-05-24 NOTE — Discharge Instructions (Addendum)
Take doxycycline antibiotic twice daily for the next 10 days with food to avoid GI upset.  Take ibuprofen 600 mg every 6 hours as needed for pain.  Your next dose of ibuprofen may be tonight at 10 PM since you were given some the clinic today.  Work note is at end of packet.  Change the dressing to your abscess using the gauze and tape twice daily.  We will call you if any of your lab work is abnormal.  Follow-up with a primary care provider.  Someone from [facility] health will be reaching out to you to help you set this up.  You may also go to [email or web address] to self schedule with a primary care provider near you.  If you develop any new or worsening symptoms or do not improve in the next 2 to 3 days, please return.  If your symptoms are severe, please go to the emergency room.  Follow-up with your primary care provider for further evaluation and management of your symptoms as well as ongoing wellness visits.  I hope you feel better!

## 2022-05-24 NOTE — ED Provider Notes (Signed)
May    CSN: [id number] Arrival date & time: 05/24/22  1131      History   Chief Complaint Chief Complaint  Patient presents with  . Rt Breast Pain    HPI Renee Schroeder is a 46 y.o. female.   Patient presents urgent care for evaluation of draining hidradenitis suppurativa lesion to her inferior right breast that has been draining purulent material for the last 3 months.  She states that the drainage has become worse over the last few weeks.  She has not been wearing a bra to try not to aggravate the area and has been applying warm compresses to the abscess without relief of drainage.  There are no other or draining areas to her body.  Area below right breast is very painful to palpation and patient currently rates pain at a 6 on a scale of 0-10.  She has not taken any medication for the pain at home.  She denies diagnosis of diabetes and recent antibiotic use.  She does not currently have a primary care provider.    Past Medical History:  Diagnosis Date  . Gestational diabetes   . Gestational diabetes 2018  . Hydradenitis     Patient Active Problem List   Diagnosis Date Noted  . Screening breast examination 09/20/2019  . Breast lump on left side at 9 o'clock position 09/20/2019  . Normal labor 08/21/2017  . Encounter for sterilization 08/21/2017  . History of hidradenitis suppurativa 07/06/2017  . Urinary tract infection in mother during second trimester of pregnancy 04/28/2017  . Supervision of high risk pregnancy, antepartum 04/11/2017  . GDM, class A2 04/11/2017  . AMA (advanced maternal age) multigravida 35+ 04/11/2017    Past Surgical History:  Procedure Laterality Date  . DILATION AND CURETTAGE OF UTERUS    . ECTOPIC PREGNANCY SURGERY    . IRRIGATION AND DEBRIDEMENT ABSCESS N/A 03/14/2016   Procedure: IRRIGATION AND DEBRIDEMENT ABSCESS;  Surgeon: Jackolyn Confer, MD;  Location: [facility];  Service: General;  Laterality: N/A;  . LAPAROSCOPY   05/26/2012   Procedure: LAPAROSCOPY OPERATIVE;  Surgeon: Mora Bellman, MD;  Location: [facility];  Service: Gynecology;  Laterality: N/A;  Operative Laparoscopy with Right Salpingectomy  . SALPINGECTOMY    . Sweat glands removed from rt. under arm Right   . TUBAL LIGATION Left 08/21/2017   Procedure: LEFT POST PARTUM TUBAL LIGATION;  Surgeon: Lavonia Drafts, MD;  Location: [facility];  Service: Gynecology;  Laterality: Left;  History of Right Salpingectomy    OB History     Gravida  5   Para  3   Term  3   Preterm  0   AB  2   Living  3      SAB  0   IAB  1   Ectopic  1   Multiple  0   Live Births  3            Home Medications    Prior to Admission medications   Medication Sig Start Date End Date Taking? Authorizing Provider  doxycycline (VIBRAMYCIN) 100 MG capsule Take 1 capsule (100 mg total) by mouth 2 (two) times daily. 05/24/22  Yes Talbot Grumbling, FNP  ibuprofen (ADVIL) 600 MG tablet Take 1 tablet (600 mg total) by mouth every 6 (six) hours as needed. 05/24/22  Yes StanhopeStasia Cavalier, FNP    Family History Family History  Problem Relation Age of Onset  . Healthy Mother   .  Healthy Father   . Breast cancer Maternal Aunt   . Other Neg Hx     Social History Social History   Tobacco Use  . Smoking status: Every Day    Packs/day: 1.00    Types: Cigarettes  . Smokeless tobacco: Never  Vaping Use  . Vaping Use: Never used  Substance Use Topics  . Alcohol use: Yes    Comment: occ  . Drug use: No     Allergies   Patient has no known allergies.   Review of Systems Review of Systems Per HPI  Physical Exam Triage Vital Signs ED Triage Vitals  Enc Vitals Group     BP 05/24/22 1312 107/78     Pulse Rate 05/24/22 1312 77     Resp 05/24/22 1312 18     Temp 05/24/22 1312 98.4 F (36.9 C)     Temp src --      SpO2 05/24/22 1312 97 %     Weight --      Height --      Head Circumference --      Peak Flow --       Pain Score 05/24/22 1309 6     Pain Loc --      Pain Edu? --      Excl. in [place]? --    No data found.  Updated Vital Signs BP 107/78   Pulse 77   Temp 98.4 F (36.9 C)   Resp 18   LMP 04/19/2022   SpO2 97%   Visual Acuity Right Eye Distance:   Left Eye Distance:   Bilateral Distance:    Right Eye Near:   Left Eye Near:    Bilateral Near:     Physical Exam Vitals and nursing note reviewed.  Constitutional:      Appearance: Normal appearance. She is not ill-appearing or toxic-appearing.     Comments: Very pleasant patient sitting on exam in position of comfort table in no acute distress.   HENT:     Head: Normocephalic and atraumatic.     Right Ear: Hearing and external ear normal.     Left Ear: Hearing and external ear normal.     Nose: Nose normal.     Mouth/Throat:     Lips: Pink.     Mouth: Mucous membranes are moist.  Eyes:     General: Lids are normal. Vision grossly intact. Gaze aligned appropriately.     Extraocular Movements: Extraocular movements intact.     Conjunctiva/sclera: Conjunctivae normal.  Pulmonary:     Effort: Pulmonary effort is normal.  Abdominal:     Palpations: Abdomen is soft.  Musculoskeletal:     Cervical back: Neck supple.  Skin:    General: Skin is warm and dry.     Capillary Refill: Capillary refill takes less than 2 seconds.     Findings: Abscess present. No rash.     Comments: Abscess inferior to right breast draining purulent drainage through a very small/pinpoint opening.  No area of fluctuance palpated.  Abscess is approximately 3 to 4 cm long and 0.5 cm wide without tunneling or erythema.  Hurley staging stage I.  Neurological:     General: No focal deficit present.     Mental Status: She is alert and oriented to person, place, and time. Mental status is at baseline.     Cranial Nerves: No dysarthria or facial asymmetry.     Gait: Gait is intact.  Psychiatric:  Mood and Affect: Mood normal.        Speech: Speech  normal.        Behavior: Behavior normal.        Thought Content: Thought content normal.        Judgment: Judgment normal.     UC Treatments / Results  Labs (all labs ordered are listed, but only abnormal results are displayed) Labs Reviewed  CBC WITH DIFFERENTIAL/PLATELET  BASIC METABOLIC PANEL    EKG   Radiology No results found.  Procedures Procedures (including critical care time)  Medications Ordered in UC Medications  ibuprofen (ADVIL) tablet 800 mg (800 mg Oral Given 05/24/22 1342)    Initial Impression / Assessment and Plan / UC Course  I have reviewed the triage vital signs and the nursing notes.  Pertinent labs & imaging results that were available during my care of the patient were reviewed by me and considered in my medical decision making (see chart for details).  Hidradenitis suppurativa to right inferior breast  Symptoms and physical exam are consistent with hidradenitis suppurativa abscess.  There is no area of fluctuance indicating need for incision and drainage at this time.  Plan to treat abscess with doxycycline twice daily for the next 10 days.  Patient to continue warm compresses to express infected material.  She may take 600 mg ibuprofen for pain and inflammation at home every 6 hours.  Patient given 800 mg ibuprofen in the clinic today.  No ibuprofen until 10 PM tonight.  Patient has a history of elevated hemoglobin A1c which can contribute to hidradenitis suppurativa.  She has not had lab work in the last few years and has a history of gestational diabetes.  CBC and BMP drawn today to assess renal function and blood counts.  Patient to follow-up with primary care for further evaluation and management.  She is without polydipsia and polyuria at this time.  No acute confusion or other hyperglycemia symptoms present.  PCP assistance initiated.  Patient given website to schedule appointment with primary care provider as well.  Counseled patient regarding  appropriate use of medications and potential side effects for all medications recommended or prescribed today. Discussed red flag signs and symptoms of worsening condition,when to call the PCP office, return to urgent care, and when to seek higher level of care. Patient verbalizes understanding and agreement with plan. All questions answered. Patient discharged in stable condition.   Final Clinical Impressions(s) / UC Diagnoses   Final diagnoses:  Hidradenitis suppurativa     Discharge Instructions      Take doxycycline antibiotic twice daily for the next 10 days with food to avoid GI upset.  Take ibuprofen 600 mg every 6 hours as needed for pain.  Your next dose of ibuprofen may be tonight at 10 PM since you were given some the clinic today.  Work note is at end of packet.  Change the dressing to your abscess using the gauze and tape twice daily.  We will call you if any of your lab work is abnormal.  Follow-up with a primary care provider.  Someone from [facility] health will be reaching out to you to help you set this up.  You may also go to [email or web address] to self schedule with a primary care provider near you.  If you develop any new or worsening symptoms or do not improve in the next 2 to 3 days, please return.  If your symptoms are severe, please go to the emergency room.  Follow-up with your primary care provider for further evaluation and management of your symptoms as well as ongoing wellness visits.  I hope you feel better!     ED Prescriptions     Medication Sig Dispense Auth. Provider   ibuprofen (ADVIL) 600 MG tablet Take 1 tablet (600 mg total) by mouth every 6 (six) hours as needed. 30 tablet Talbot Grumbling, FNP   doxycycline (VIBRAMYCIN) 100 MG capsule Take 1 capsule (100 mg total) by mouth 2 (two) times daily. 20 capsule Talbot Grumbling, FNP      PDMP not reviewed this encounter.

## 2022-06-04 ENCOUNTER — Encounter (HOSPITAL_COMMUNITY): Payer: Self-pay

## 2022-06-07 ENCOUNTER — Encounter: Payer: Self-pay | Admitting: General Practice

## 2022-07-06 ENCOUNTER — Ambulatory Visit (HOSPITAL_COMMUNITY)
Admission: EM | Admit: 2022-07-06 | Discharge: 2022-07-06 | Disposition: A | Discharge status: Home / Self Care | Payer: Commercial Managed Care - HMO | Attending: Physician Assistant | Admitting: Physician Assistant

## 2022-07-06 ENCOUNTER — Encounter (HOSPITAL_COMMUNITY): Payer: Self-pay

## 2022-07-06 DIAGNOSIS — J029 Acute pharyngitis, unspecified: Secondary | ICD-10-CM | POA: Diagnosis not present

## 2022-07-06 DIAGNOSIS — J020 Streptococcal pharyngitis: Secondary | ICD-10-CM

## 2022-07-06 LAB — POCT RAPID STREP A, ED / UC: Streptococcus, Group A Screen (Direct): POSITIVE — AB

## 2022-07-06 MED ORDER — AMOXICILLIN 500 MG PO CAPS: 500.0000 mg | ORAL_CAPSULE | Freq: Three times a day (TID) | ORAL | 0 refills | Status: DC

## 2022-07-06 NOTE — Discharge Instructions (Addendum)
Advised take Tylenol or ibuprofen for pain relief from the sore throat. Advised to use salt water gargles or lozenges to help soothe the throat. Advised to follow-up with PCP or return to urgent care if symptoms fail to improve.

## 2022-07-06 NOTE — ED Provider Notes (Signed)
[facility]    CSN: [id number] Arrival date & time: 07/06/22  1442      History   Chief Complaint Chief Complaint  Patient presents with   Sore Throat    HPI Renee Schroeder is a 46 y.o. female.   46 year old female presents with sore throat.  Patient indicates for the past 48 hours she has been having progressive sore throat and painful swallowing.  Patient indicates that she did not notice white patches on her tonsils until this morning.  Patient indicates that her son's father was diagnosed with strep recently, and then she had 2 friends that are also diagnosed with strep but she has not been around them.  Patient indicates she is not having fever, chills, body aches or pains.  Patient indicates she is not having any cough, congestion, or upper respiratory symptoms.  Patient relates she is tolerating fluids well.   Sore Throat    Past Medical History:  Diagnosis Date   Gestational diabetes    Gestational diabetes 2018   Hydradenitis     Patient Active Problem List   Diagnosis Date Noted   Screening breast examination 09/20/2019   Breast lump on left side at 9 o'clock position 09/20/2019   Normal labor 08/21/2017   Encounter for sterilization 08/21/2017   History of hidradenitis suppurativa 07/06/2017   Urinary tract infection in mother during second trimester of pregnancy 04/28/2017   Supervision of high risk pregnancy, antepartum 04/11/2017   GDM, class A2 04/11/2017   AMA (advanced maternal age) multigravida 35+ 04/11/2017    Past Surgical History:  Procedure Laterality Date   DILATION AND CURETTAGE OF UTERUS     ECTOPIC PREGNANCY SURGERY     IRRIGATION AND DEBRIDEMENT ABSCESS N/A 03/14/2016   Procedure: IRRIGATION AND DEBRIDEMENT ABSCESS;  Surgeon: Jackolyn Confer, MD;  Location: [facility];  Service: General;  Laterality: N/A;   LAPAROSCOPY  05/26/2012   Procedure: LAPAROSCOPY OPERATIVE;  Surgeon: Mora Bellman, MD;  Location: [facility];  Service:  Gynecology;  Laterality: N/A;  Operative Laparoscopy with Right Salpingectomy   SALPINGECTOMY     Sweat glands removed from rt. under arm Right    TUBAL LIGATION Left 08/21/2017   Procedure: LEFT POST PARTUM TUBAL LIGATION;  Surgeon: Lavonia Drafts, MD;  Location: [facility];  Service: Gynecology;  Laterality: Left;  History of Right Salpingectomy    OB History     Gravida  5   Para  3   Term  3   Preterm  0   AB  2   Living  3      SAB  0   IAB  1   Ectopic  1   Multiple  0   Live Births  3            Home Medications    Prior to Admission medications   Medication Sig Start Date End Date Taking? Authorizing Provider  amoxicillin (AMOXIL) 500 MG capsule Take 1 capsule (500 mg total) by mouth 3 (three) times daily. 07/06/22  Yes Nyoka Lint, PA-C  doxycycline (VIBRAMYCIN) 100 MG capsule Take 1 capsule (100 mg total) by mouth 2 (two) times daily. 05/24/22   Talbot Grumbling, FNP  ibuprofen (ADVIL) 600 MG tablet Take 1 tablet (600 mg total) by mouth every 6 (six) hours as needed. 05/24/22   Talbot Grumbling, FNP    Family History Family History  Problem Relation Age of Onset   Healthy Mother    Healthy  Father    Breast cancer Maternal Aunt    Other Neg Hx     Social History Social History   Tobacco Use   Smoking status: Every Day    Packs/day: 1.00    Types: Cigarettes   Smokeless tobacco: Never  Vaping Use   Vaping Use: Never used  Substance Use Topics   Alcohol use: Yes    Comment: occ   Drug use: No     Allergies   Patient has no known allergies.   Review of Systems Review of Systems  HENT:  Positive for sore throat.      Physical Exam Triage Vital Signs ED Triage Vitals  Enc Vitals Group     BP 07/06/22 1454 108/74     Pulse Rate 07/06/22 1454 86     Resp 07/06/22 1454 16     Temp 07/06/22 1454 98.2 F (36.8 C)     Temp Source 07/06/22 1454 Oral     SpO2 07/06/22 1454 98 %     Weight 07/06/22 1455  210 lb 1.6 oz (95.3 kg)     Height 07/06/22 1455 '5\' 9"'$  (1.753 m)     Head Circumference --      Peak Flow --      Pain Score 07/06/22 1455 7     Pain Loc --      Pain Edu? --      Excl. in [place]? --    No data found.  Updated Vital Signs BP 108/74 (BP Location: Right Arm)   Pulse 86   Temp 98.2 F (36.8 C) (Oral)   Resp 16   Ht '5\' 9"'$  (1.753 m)   Wt 210 lb 1.6 oz (95.3 kg)   LMP 04/19/2022 (Within Days) Comment: Tubal ligation  SpO2 98%   BMI 31.03 kg/m   Visual Acuity Right Eye Distance:   Left Eye Distance:   Bilateral Distance:    Right Eye Near:   Left Eye Near:    Bilateral Near:     Physical Exam Constitutional:      Appearance: She is well-developed.  HENT:     Right Ear: Tympanic membrane and ear canal normal.     Left Ear: Tympanic membrane and ear canal normal.     Mouth/Throat:     Mouth: Mucous membranes are moist.     Pharynx: Uvula midline. Oropharyngeal exudate and posterior oropharyngeal erythema present. No pharyngeal swelling.  Cardiovascular:     Rate and Rhythm: Normal rate and regular rhythm.     Heart sounds: Normal heart sounds.  Pulmonary:     Effort: Pulmonary effort is normal.     Breath sounds: Normal air entry. No wheezing, rhonchi or rales.  Lymphadenopathy:     Cervical: Cervical adenopathy (mild bilat anterior adenopathy) present.  Neurological:     Mental Status: She is alert.      UC Treatments / Results  Labs (all labs ordered are listed, but only abnormal results are displayed) Labs Reviewed  POCT RAPID STREP A, ED / UC - Abnormal; Notable for the following components:      Result Value   Streptococcus, Group A Screen (Direct) POSITIVE (*)    All other components within normal limits    EKG   Radiology No results found.  Procedures Procedures (including critical care time)  Medications Ordered in UC Medications - No data to display  Initial Impression / Assessment and Plan / UC Course  I have reviewed the  triage vital  signs and the nursing notes.  Pertinent labs & imaging results that were available during my care of the patient were reviewed by me and considered in my medical decision making (see chart for details).    Plan: 1.  Advised take amoxicillin 500 mg 3 times daily until completed to treat strep throat 2.  Advised take ibuprofen or Tylenol to help soothe pain of the throat. 3.  Advised to follow-up with PCP or return to urgent care if symptoms fail to improve. Final Clinical Impressions(s) / UC Diagnoses   Final diagnoses:  Sore throat  Strep pharyngitis     Discharge Instructions      Advised take Tylenol or ibuprofen for pain relief from the sore throat. Advised to use salt water gargles or lozenges to help soothe the throat. Advised to follow-up with PCP or return to urgent care if symptoms fail to improve.     ED Prescriptions     Medication Sig Dispense Auth. Provider   amoxicillin (AMOXIL) 500 MG capsule Take 1 capsule (500 mg total) by mouth 3 (three) times daily. 21 capsule Nyoka Lint, PA-C      PDMP not reviewed this encounter.   Nyoka Lint, PA-C 07/06/22 1513

## 2022-07-06 NOTE — ED Triage Notes (Signed)
Patient having sore throat for 2 days. No fever, cough, chills, etc. No one around the Patient with the same symptoms.

## 2022-08-05 NOTE — [facility]regational Nurse Program (Signed)
Brief encounter with member to explain services of Congregational Nurse and introduce myself to her.  Vinnie Langton, RN, [facility], [phone]

## 2024-06-16 DIAGNOSIS — Z419 Encounter for procedure for purposes other than remedying health state, unspecified: Secondary | ICD-10-CM | POA: Diagnosis not present

## 2024-07-17 DIAGNOSIS — Z419 Encounter for procedure for purposes other than remedying health state, unspecified: Secondary | ICD-10-CM | POA: Diagnosis not present

## 2024-08-17 DIAGNOSIS — Z419 Encounter for procedure for purposes other than remedying health state, unspecified: Secondary | ICD-10-CM | POA: Diagnosis not present

## 2024-12-20 ENCOUNTER — Encounter (INDEPENDENT_AMBULATORY_CARE_PROVIDER_SITE_OTHER): Payer: Self-pay | Admitting: Primary Care

## 2024-12-20 ENCOUNTER — Ambulatory Visit (INDEPENDENT_AMBULATORY_CARE_PROVIDER_SITE_OTHER): Admitting: Primary Care

## 2024-12-20 VITALS — BP 120/79 | HR 91 | Resp 16 | Ht 69.0 in | Wt 230.0 lb

## 2024-12-20 DIAGNOSIS — Z6833 Body mass index (BMI) 33.0-33.9, adult: Secondary | ICD-10-CM | POA: Diagnosis not present

## 2024-12-20 DIAGNOSIS — M545 Low back pain, unspecified: Secondary | ICD-10-CM

## 2024-12-20 DIAGNOSIS — Z7689 Persons encountering health services in other specified circumstances: Secondary | ICD-10-CM

## 2024-12-20 DIAGNOSIS — R7309 Other abnormal glucose: Secondary | ICD-10-CM | POA: Diagnosis not present

## 2024-12-20 DIAGNOSIS — G8929 Other chronic pain: Secondary | ICD-10-CM | POA: Diagnosis not present

## 2024-12-20 DIAGNOSIS — Z1211 Encounter for screening for malignant neoplasm of colon: Secondary | ICD-10-CM

## 2024-12-20 DIAGNOSIS — N951 Menopausal and female climacteric states: Secondary | ICD-10-CM | POA: Diagnosis not present

## 2024-12-20 DIAGNOSIS — L732 Hidradenitis suppurativa: Secondary | ICD-10-CM

## 2024-12-20 DIAGNOSIS — L853 Xerosis cutis: Secondary | ICD-10-CM

## 2024-12-20 DIAGNOSIS — Z1231 Encounter for screening mammogram for malignant neoplasm of breast: Secondary | ICD-10-CM

## 2024-12-20 DIAGNOSIS — E66811 Obesity, class 1: Secondary | ICD-10-CM

## 2024-12-20 DIAGNOSIS — E6609 Other obesity due to excess calories: Secondary | ICD-10-CM | POA: Diagnosis not present

## 2024-12-20 NOTE — Progress Notes (Signed)
 "  New Patient Office Visit  Subjective    Patient ID: Renee Schroeder female  DOB: 10/11/1976  Age: 49 y.o. MRN: [id number]   CC:   HPI     New Patient (Initial Visit)    Additional comments: Establish care  Pt states she has HS and starting to get them more frequently   Dry skin   Back pain   Pt states she saw some film over her eye and it felt weird to her       Last edited by Casimir Juvenal SAUNDERS, RMA on 12/20/2024  3:18 PM.      HPI  Renee Schroeder is a 49 year old obese female presents to establish care. Does not go to doctors for health maintenance care. Patient stated she  had a film over her right eye no pain or vision changes. Self resolved . Medications Ordered Prior to Encounter[1]   Allergies[2]  Past Medical History:  Diagnosis Date   Gestational diabetes    Gestational diabetes 2018   Hydradenitis      Past Surgical History:  Procedure Laterality Date   DILATION AND CURETTAGE OF UTERUS     ECTOPIC PREGNANCY SURGERY     IRRIGATION AND DEBRIDEMENT ABSCESS N/A 03/14/2016   Procedure: IRRIGATION AND DEBRIDEMENT ABSCESS;  Surgeon: Krystal Russell, MD;  Location: [facility];  Service: General;  Laterality: N/A;   LAPAROSCOPY  05/26/2012   Procedure: LAPAROSCOPY OPERATIVE;  Surgeon: Winton Felt, MD;  Location: [facility];  Service: Gynecology;  Laterality: N/A;  Operative Laparoscopy with Right Salpingectomy   SALPINGECTOMY     Sweat glands removed from rt. under arm Right    TUBAL LIGATION Left 08/21/2017   Procedure: LEFT POST PARTUM TUBAL LIGATION;  Surgeon: Corene Coy, MD;  Location: [facility];  Service: Gynecology;  Laterality: Left;  History of Right Salpingectomy     Family History  Problem Relation Age of Onset   Healthy Mother    Healthy Father    Breast cancer Maternal Aunt    Other Neg Hx     Social History   Socioeconomic History   Marital status: Single    Spouse name: Not on file   Number of children: Not on file    Years of education: Not on file   Highest education level: 11th grade  Occupational History   Not on file  Tobacco Use   Smoking status: Every Day    Current packs/day: 1.00    Types: Cigarettes   Smokeless tobacco: Never  Vaping Use   Vaping status: Never Used  Substance and Sexual Activity   Alcohol use: Yes    Comment: occ   Drug use: No   Sexual activity: Yes    Birth control/protection: Surgical, Condom  Other Topics Concern   Not on file  Social History Narrative   Not on file   Social Drivers of Health   Tobacco Use: High Risk (12/20/2024)   Patient History    Smoking Tobacco Use: Every Day    Smokeless Tobacco Use: Never    Passive Exposure: Not on file  Financial Resource Strain: Not on File (03/31/2022)   Received from [employer]    Financial Resource Strain: 0  Food Insecurity: Not on File (03/31/2022)   Received from [facility] Insecurity    Food: 0  Transportation Needs: Not on File (03/31/2022)   Received from [employer] Needs    Transportation: 0  Physical Activity: Not on File (03/31/2022)   Received from [facility]   Physical Activity    Physical Activity: 0  Stress: Not on File (03/31/2022)   Received from [facility]   Stress    Stress: 0  Social Connections: Not on File (03/31/2022)   Received from [facility]   Social Connections    Social Connections and Isolation: 0  Intimate Partner Violence: Not on file  Depression (PHQ2-9): Not on file  Alcohol Screen: Not on file  Housing: Not on file  Utilities: Not on file  Health Literacy: Not on file   Health Maintenance  Topic Date Due   Complete foot exam   Never done   Eye exam for diabetics  Never done   Kidney health urinalysis for diabetes  Never done   Hepatitis C Screening  Never done   Pneumococcal Vaccine for age over 60 (1 of 2 - PCV) Never done   Hepatitis B Vaccine (1 of 3 - 19+ 3-dose series) Never done   Colon Cancer Screening  Never done   Pap with HPV  screening  03/24/2020   Hemoglobin A1C  06/17/2020   Breast Cancer Screening  09/19/2021   Yearly kidney function blood test for diabetes  05/25/2023   Flu Shot  Never done   COVID-19 Vaccine (1 - 2025-26 season) Never done   Zoster (Shingles) Vaccine (1 of 2) Never done   DTaP/Tdap/Td vaccine (2 - Td or Tdap) 06/16/2027   HPV Vaccine (No Doses Required) Completed   HIV Screening  Completed   Meningitis B Vaccine  Aged Out    Objective    BP 120/79   Pulse 91   Resp 16   Ht 5' 9 (1.753 m)   Wt 230 lb (104.3 kg)   SpO2 100%   BMI 33.97 kg/m    Physical Exam Vitals reviewed.  Constitutional:      Appearance: Normal appearance. She is obese.  HENT:     Head: Normocephalic.     Right Ear: Tympanic membrane, ear canal and external ear normal.     Left Ear: Tympanic membrane, ear canal and external ear normal.     Nose: Nose normal.     Mouth/Throat:     Mouth: Mucous membranes are moist.  Eyes:     Extraocular Movements: Extraocular movements intact.     Pupils: Pupils are equal, round, and reactive to light.  Cardiovascular:     Rate and Rhythm: Normal rate.  Pulmonary:     Effort: Pulmonary effort is normal.     Breath sounds: Normal breath sounds.  Abdominal:     General: Bowel sounds are normal.     Palpations: Abdomen is soft.  Musculoskeletal:        General: Normal range of motion.     Cervical back: Normal range of motion.  Skin:    General: Skin is warm and dry.  Neurological:     Mental Status: She is alert and oriented to person, place, and time.  Psychiatric:        Mood and Affect: Mood normal.        Behavior: Behavior normal.        Thought Content: Thought content normal.      Assessment & Plan:  Renee Schroeder was seen today for new patient (initial visit).  Diagnoses and all orders for this visit:  Encounter to establish care  Hidradenitis suppurativa of multiple sites     Ambulatory referral to Dermatology   -  CMP14+EGFR;  Future  Dry skin dermatitis Use Mositurizing lotion - skin dryer in winter  -     Ambulatory referral to Dermatology  Chronic bilateral low back pain without sciatica Position change from sitting to standing causes her to feel body is back is not align. PT eval  Encounter for screening mammogram for malignant neoplasm of breast -     MM 3D SCREENING MAMMOGRAM BILATERAL BREAST; Future  Colon cancer screening -     Ambulatory referral to Gastroenterology  Elevated glucose level -     Hemoglobin A1c; Future -     Microalbumin / creatinine urine ratio; Future  Class 1 obesity due to excess calories with serious comorbidity and body mass index (BMI) of 33.0 to 33.9 in adult Obesity is 30-39 indicating an excess in caloric intake or underlining conditions. This may lead to other co-morbidities. Educated on lifestyle modifications of diet and exercise which may reduce obesity.   -     Lipid panel; Future  Perimenopausal -     CBC with Differential/Platelet; Future    Follow-up:  Return in about 6 months (around 06/19/2025).  The above assessment and management plan was discussed with the patient. The patient verbalized understanding of and has agreed to the management plan. Patient is aware to call the clinic if symptoms fail to improve or worsen. Patient is aware when to return to the clinic for a follow-up visit. Patient educated on when it is appropriate to go to the emergency department.   Rosaline Bohr, NP-C     [1]  No current outpatient medications on file prior to visit.   No current facility-administered medications on file prior to visit.  [2] No Known Allergies  "

## 2024-12-20 NOTE — Patient Instructions (Signed)
 Obesity, Adult Obesity is having too much body fat. Being obese means that your weight is more than what is healthy for you.  BMI (body mass index) is a number that explains how much body fat you have. If you have a BMI of 30 or more, you are obese. Obesity can cause serious health problems, such as: Stroke. Coronary artery disease (CAD). Type 2 diabetes. Some types of cancer. High blood pressure (hypertension). High cholesterol. Gallbladder stones. Obesity can also contribute to: Osteoarthritis. Sleep apnea. Infertility problems. What are the causes? Eating meals each day that are high in calories, sugar, and fat. Drinking a lot of drinks that have sugar in them. Being born with genes that may make you more likely to become obese. Having a medical condition that causes obesity. Taking certain medicines. Sitting a lot (having a sedentary lifestyle). Not getting enough sleep. What increases the risk? Having a family history of obesity. Living in an area with limited access to: [place], recreation centers, or sidewalks. Healthy food choices, such as grocery stores and farmers' markets. What are the signs or symptoms? The main sign is having too much body fat. How is this treated? Treatment for this condition often includes changing your lifestyle. Treatment may include: Changing your diet. This may include making a healthy meal plan. Exercise. This may include activity that causes your heart to beat faster (aerobic exercise) and strength training. Work with your doctor to design a program that works for you. Medicine to help you lose weight. This may be used if you are not able to lose one pound a week after 6 weeks of healthy eating and more exercise. Treating conditions that cause the obesity. Surgery. Options may include gastric banding and gastric bypass. This may be done if: Other treatments have not helped to improve your condition. You have a BMI of 40 or higher. You have  life-threatening health problems related to obesity. Follow these instructions at home: Eating and drinking  Follow advice from your doctor about what to eat and drink. Your doctor may tell you to: Limit fast food, sweets, and processed snack foods. Choose low-fat options. For example, choose low-fat milk instead of whole milk. Eat five or more servings of fruits or vegetables each day. Eat at home more often. This gives you more control over what you eat. Choose healthy foods when you eat out. Learn to read food labels. This will help you learn how much food is in one serving. Keep low-fat snacks available. Avoid drinks that have a lot of sugar in them. These include soda, fruit juice, iced tea with sugar, and flavored milk. Drink enough water to keep your pee (urine) pale yellow. Do not go on fad diets. Physical activity Exercise often, as told by your doctor. Most adults should get up to 150 minutes of moderate-intensity exercise every week.Ask your doctor: What types of exercise are safe for you. How often you should exercise. Warm up and stretch before being active. Do slow stretching after being active (cool down). Rest between times of being active. Lifestyle Work with your doctor and a food expert (dietitian) to set a weight-loss goal that is best for you. Limit your screen time. Find ways to reward yourself that do not involve food. Do not drink alcohol if: Your doctor tells you not to drink. You are pregnant, may be pregnant, or are planning to become pregnant. If you drink alcohol: Limit how much you have to: 0-1 drink a day for women. 0-2 drinks  a day for men. Know how much alcohol is in your drink. In the [place] one drink equals one 12 oz bottle of beer (355 mL), one 5 oz glass of wine (148 mL), or one 1 oz glass of hard liquor (44 mL). General instructions Keep a weight-loss journal. This can help you keep track of: The food that you eat. How much exercise you  get. Take over-the-counter and prescription medicines only as told by your doctor. Take vitamins and supplements only as told by your doctor. Think about joining a support group. Pay attention to your mental health as obesity can lead to depression or self esteem issues. Keep all follow-up visits. Contact a doctor if: You cannot meet your weight-loss goal after you have changed your diet and lifestyle for 6 weeks. You are having trouble breathing. Summary Obesity is having too much body fat. Being obese means that your weight is more than what is healthy for you. Work with your doctor to set a weight-loss goal. Get regular exercise as told by your doctor. This information is not intended to replace advice given to you by your health care provider. Make sure you discuss any questions you have with your health care provider. Document Revised: 06/30/2021 Document Reviewed: 06/30/2021 Elsevier Patient Education  2024 [employer].

## 2025-01-04 ENCOUNTER — Telehealth (INDEPENDENT_AMBULATORY_CARE_PROVIDER_SITE_OTHER): Payer: Self-pay

## 2025-01-04 NOTE — Telephone Encounter (Signed)
 Reached out to pt in regards to fax received from [facility] that they have made several attempts to contact pt to schedule.  Pt states she has been waiting to see what her work schedule looks like and because of the weather. Pt will be giving a call to schedule her appt.  Pt states she has contact information

## 2025-06-19 ENCOUNTER — Ambulatory Visit (INDEPENDENT_AMBULATORY_CARE_PROVIDER_SITE_OTHER): Payer: Self-pay | Admitting: Primary Care
